# Patient Record
Sex: Female | Born: 1990 | Hispanic: No | Marital: Single | Smoking: Never smoker
Health system: Southern US, Community
[De-identification: ages and names within clinical notes are randomized; demographics above are authoritative.]

## PROBLEM LIST (undated history)

## (undated) DIAGNOSIS — J45909 Unspecified asthma, uncomplicated: Secondary | ICD-10-CM

## (undated) DIAGNOSIS — F32A Depression, unspecified: Secondary | ICD-10-CM

## (undated) DIAGNOSIS — F419 Anxiety disorder, unspecified: Secondary | ICD-10-CM

## (undated) HISTORY — PX: BARIATRIC SURGERY: SHX1103

---

## 2014-07-29 ENCOUNTER — Emergency Department (HOSPITAL_COMMUNITY)
Admission: EM | Admit: 2014-07-29 | Discharge: 2014-07-30 | Disposition: A | Discharge status: Home / Self Care | Payer: Self-pay | Attending: Emergency Medicine | Admitting: Emergency Medicine

## 2014-07-29 DIAGNOSIS — R05 Cough: Secondary | ICD-10-CM

## 2014-07-29 DIAGNOSIS — J45909 Unspecified asthma, uncomplicated: Secondary | ICD-10-CM | POA: Insufficient documentation

## 2014-07-29 DIAGNOSIS — R059 Cough, unspecified: Secondary | ICD-10-CM

## 2014-07-29 DIAGNOSIS — H9209 Otalgia, unspecified ear: Secondary | ICD-10-CM | POA: Insufficient documentation

## 2014-07-29 DIAGNOSIS — R1110 Vomiting, unspecified: Secondary | ICD-10-CM | POA: Insufficient documentation

## 2014-07-29 DIAGNOSIS — R000 Tachycardia, unspecified: Secondary | ICD-10-CM | POA: Insufficient documentation

## 2014-07-29 DIAGNOSIS — R0789 Other chest pain: Secondary | ICD-10-CM | POA: Insufficient documentation

## 2014-07-29 DIAGNOSIS — J159 Unspecified bacterial pneumonia: Secondary | ICD-10-CM | POA: Insufficient documentation

## 2014-07-29 DIAGNOSIS — Z79899 Other long term (current) drug therapy: Secondary | ICD-10-CM | POA: Insufficient documentation

## 2014-07-29 DIAGNOSIS — J189 Pneumonia, unspecified organism: Secondary | ICD-10-CM

## 2014-07-29 HISTORY — DX: Unspecified asthma, uncomplicated: J45.909

## 2014-07-29 NOTE — ED Notes (Signed)
Patient reports that she has had cold-like symptoms x 1 week that have progressively gotten worse.  Reports productive cough and bilateral ear pain.

## 2014-07-30 ENCOUNTER — Emergency Department (HOSPITAL_COMMUNITY): Payer: Self-pay

## 2014-07-30 ENCOUNTER — Encounter (HOSPITAL_COMMUNITY): Payer: Self-pay

## 2014-07-30 MED ORDER — LEVOFLOXACIN 750 MG PO TABS
750.0000 mg | ORAL_TABLET | Freq: Once | ORAL | Status: AC
  Administered 2014-07-30: 750 mg via ORAL
  Filled 2014-07-30: qty 1

## 2014-07-30 MED ORDER — LEVOFLOXACIN 250 MG PO TABS: 750.0000 mg | ORAL_TABLET | Freq: Every day | ORAL | Status: DC

## 2014-07-30 MED ORDER — BENZONATATE 100 MG PO CAPS: 100.0000 mg | ORAL_CAPSULE | Freq: Three times a day (TID) | ORAL | Status: DC

## 2014-07-30 MED ORDER — ALBUTEROL SULFATE HFA 108 (90 BASE) MCG/ACT IN AERS
2.0000 | INHALATION_SPRAY | Freq: Once | RESPIRATORY_TRACT | Status: AC
  Administered 2014-07-30: 2 via RESPIRATORY_TRACT
  Filled 2014-07-30: qty 6.7

## 2014-07-30 NOTE — ED Notes (Signed)
Patient transported to X-ray 

## 2014-07-30 NOTE — ED Provider Notes (Signed)
CSN: [id number]     Arrival date & time 07/29/14  2352 History   First MD Initiated Contact with Patient 07/30/14 0102     Chief Complaint  Patient presents with  . Cough    (Consider location/radiation/quality/duration/timing/severity/associated sxs/prior Treatment) HPI Comments: Patient is a 24 year old female with a history of asthma who presents to the emergency department for further evaluation of cough. Patient states that she has been experiencing a cough 4-5 days. She states the cough is deep and productive of green phlegm. She feels an associated generalized chest pressure which is worse when coughing. She also endorses associated posttussive emesis, chills, and fever of 101F 2 days ago. Patient has been taking NyQuil, sinus pills, Motrin, and tests and DM for symptoms without relief. She states her son was sick with similar symptoms initially, but his illness improved and hers feels as though it is worsening. No associated rhinorrhea, drooling, difficulty swallowing, hematemesis, or syncope.  Patient is a 24 y.o. female presenting with cough. The history is provided by the patient. No language interpreter was used.  Cough Associated symptoms: chills, ear pain and fever   Associated symptoms: no rhinorrhea     Past Medical History  Diagnosis Date  . Asthma    History reviewed. No pertinent past surgical history. History reviewed. No pertinent family history. History  Substance Use Topics  . Smoking status: Never Smoker   . Smokeless tobacco: Not on file  . Alcohol Use: No   OB History    No data available      Review of Systems  Constitutional: Positive for fever and chills.  HENT: Positive for congestion and ear pain. Negative for drooling, rhinorrhea and trouble swallowing.   Respiratory: Positive for cough and chest tightness.   Gastrointestinal: Positive for vomiting. Negative for nausea and abdominal pain.  Neurological: Negative for syncope.  All other systems  reviewed and are negative.   Allergies  Prednisone  Home Medications   Prior to Admission medications   Medication Sig Start Date End Date Taking? Authorizing Provider  benzonatate (TESSALON) 100 MG capsule Take 1 capsule (100 mg total) by mouth every 8 (eight) hours. 07/30/14   Antony Madura, PA-C  levofloxacin (LEVAQUIN) 250 MG tablet Take 3 tablets (750 mg total) by mouth daily. Begin your second dose on 07/31/14; your first dose was given in the ED on 07/30/14 07/30/14   Antony Madura, PA-C  venlafaxine XR (EFFEXOR-XR) 75 MG 24 hr capsule Take 75 mg by mouth daily with breakfast.   Yes Historical Provider, MD   BP 136/95 mmHg  Pulse 114  Temp(Src) 99 F (37.2 C) (Oral)  Resp 18  Ht  (1.626 m)  Wt 207 lb (93.895 kg)  BMI 35.51 kg/m2  SpO2 99%  LMP 07/23/2014   Physical Exam  Constitutional: She is oriented to person, place, and time. She appears well-developed and well-nourished. No distress.  Nontoxic/nonseptic appearing  HENT:  Head: Normocephalic and atraumatic.  Mouth/Throat: Oropharynx is clear and moist. No oropharyngeal exudate.  Audible nasal congestion. No rhinorrhea. Uvula midline and oropharynx clear. Patient tolerating secretions without difficulty. No maxillary or frontal sinus tenderness. No bulging, retraction, or perforation of bilateral TMs. No evidence of otitis media or mastoiditis.  Eyes: Conjunctivae and EOM are normal. No scleral icterus.  Neck: Normal range of motion.  No nuchal rigidity or meningismus  Cardiovascular: Regular rhythm and intact distal pulses.   Mild tachycardia  Pulmonary/Chest: Effort normal and breath sounds normal. No respiratory distress. She  has no wheezes. She has no rales.  Respirations even and unlabored. No wheezing or rales appreciated. No tachypnea or dyspnea.  Musculoskeletal: Normal range of motion.  Neurological: She is alert and oriented to person, place, and time. She exhibits normal muscle tone. Coordination normal.   GCS 15. Speech is goal oriented. Patient moves extremities without ataxia.  Skin: Skin is warm and dry. No rash noted. She is not diaphoretic. No erythema. No pallor.  Psychiatric: She has a normal mood and affect. Her behavior is normal.  Nursing note and vitals reviewed.   ED Course  Procedures (including critical care time) Labs Review Labs Reviewed - No data to display  Imaging Review Dg Chest 2 View  07/30/2014   CLINICAL DATA:  Cough for 1 week, increasing in severity. Upper chest pain.  EXAM: CHEST  2 VIEW  COMPARISON:  None.  FINDINGS: Minimal patchy opacity in the right midlung zone. Left lung is clear. Cardiomediastinal contours are normal. No pleural effusion or pneumothorax. No osseous abnormality is seen.  IMPRESSION: Minimal patchy opacity right mid lung zone consistent with pneumonia.   Electronically Signed   By: Rubye OaksMelanie  Ehinger M.D.   On: 07/30/2014 02:18     EKG Interpretation None      MDM   Final diagnoses:  Cough  CAP (community acquired pneumonia)    Patient has been diagnosed with CAP via chest xray. Symptoms include 4-5 days of chills, fever, cough productive of green sputum, posttussive emesis, and chest pressure which was worse with coughing. Positive hx of sick contacts. Pt is not ill appearing, immunocompromised, and does not have multiple comorbidities. She ambulates in the ED without hypoxia. No tachypnea or dyspnea at rest. Afebrile over ED course. Believe patient is appropriate for outpatient management of her pneumonia at this time. Levaquin initiated in ED. Will d/c with 4 day course as well as Rx for Tessalon. Albuterol inhaler provided PTD. Return precautions discussed and provided. Patient agreeable to plan with no unaddressed concerns. Patient discharged in good condition    Antony MaduraKelly Lenville Hibberd, [place] JerseyPA-C 07/30/14 0315  Elwin MochaBlair Walden, MD 07/30/14 [phone]

## 2014-07-30 NOTE — ED Notes (Signed)
Pt ambulated with out assistance no complaints of shortness of breath, SPO2=100 HR= 134. RN notifited

## 2014-07-30 NOTE — Discharge Instructions (Signed)
Take Levaquin as prescribed. Use an albuterol inhaler, 2 puffs every 4 hours, as needed for cough and shortness of breath. Take Tessalon for cough as needed. Return if symptoms worsen despite recommended treatment.  Pneumonia Pneumonia is an infection of the lungs.  CAUSES Pneumonia may be caused by bacteria or a virus. Usually, these infections are caused by breathing infectious particles into the lungs (respiratory tract). SIGNS AND SYMPTOMS   Cough.  Fever.  Chest pain.  Increased rate of breathing.  Wheezing.  Mucus production. DIAGNOSIS  If you have the common symptoms of pneumonia, your health care provider will typically confirm the diagnosis with a chest X-ray. The X-ray will show an abnormality in the lung (pulmonary infiltrate) if you have pneumonia. Other tests of your blood, urine, or sputum may be done to find the specific cause of your pneumonia. Your health care provider may also do tests (blood gases or pulse oximetry) to see how well your lungs are working. TREATMENT  Some forms of pneumonia may be spread to other people when you cough or sneeze. You may be asked to wear a mask before and during your exam. Pneumonia that is caused by bacteria is treated with antibiotic medicine. Pneumonia that is caused by the influenza virus may be treated with an antiviral medicine. Most other viral infections must run their course. These infections will not respond to antibiotics.  HOME CARE INSTRUCTIONS   Cough suppressants may be used if you are losing too much rest. However, coughing protects you by clearing your lungs. You should avoid using cough suppressants if you can.  Your health care provider may have prescribed medicine if he or she thinks your pneumonia is caused by bacteria or influenza. Finish your medicine even if you start to feel better.  Your health care provider may also prescribe an expectorant. This loosens the mucus to be coughed up.  Take medicines only as  directed by your health care provider.  Do not smoke. Smoking is a common cause of bronchitis and can contribute to pneumonia. If you are a smoker and continue to smoke, your cough may last several weeks after your pneumonia has cleared.  A cold steam vaporizer or humidifier in your room or home may help loosen mucus.  Coughing is often worse at night. Sleeping in a semi-upright position in a recliner or using a couple pillows under your head will help with this.  Get rest as you feel it is needed. Your body will usually let you know when you need to rest. PREVENTION A pneumococcal shot (vaccine) is available to prevent a common bacterial cause of pneumonia. This is usually suggested for:  People over 23 years old.  Patients on chemotherapy.  People with chronic lung problems, such as bronchitis or emphysema.  People with immune system problems. If you are over 65 or have a high risk condition, you may receive the pneumococcal vaccine if you have not received it before. In some countries, a routine influenza vaccine is also recommended. This vaccine can help prevent some cases of pneumonia.You may be offered the influenza vaccine as part of your care. If you smoke, it is time to quit. You may receive instructions on how to stop smoking. Your health care provider can provide medicines and counseling to help you quit. SEEK MEDICAL CARE IF: You have a fever. SEEK IMMEDIATE MEDICAL CARE IF:   Your illness becomes worse. This is especially true if you are elderly or weakened from any other disease.  You cannot control your cough with suppressants and are losing sleep.  You begin coughing up blood.  You develop pain which is getting worse or is uncontrolled with medicines.  Any of the symptoms which initially brought you in for treatment are getting worse rather than better.  You develop shortness of breath or chest pain. MAKE SURE YOU:   Understand these instructions.  Will watch  your condition.  Will get help right away if you are not doing well or get worse. Document Released: 07/05/2005 Document Revised: 11/19/2013 Document Reviewed: 09/24/2010 [facility] Patient Information 2015 [place], [place]. This information is not intended to replace advice given to you by your health care provider. Make sure you discuss any questions you have with your health care provider.

## 2014-08-12 ENCOUNTER — Emergency Department (HOSPITAL_COMMUNITY): Payer: Self-pay

## 2014-08-12 ENCOUNTER — Emergency Department (HOSPITAL_COMMUNITY)
Admission: EM | Admit: 2014-08-12 | Discharge: 2014-08-13 | Disposition: A | Discharge status: Home / Self Care | Payer: Self-pay | Attending: Emergency Medicine | Admitting: Emergency Medicine

## 2014-08-12 ENCOUNTER — Encounter (HOSPITAL_COMMUNITY): Payer: Self-pay | Admitting: Emergency Medicine

## 2014-08-12 DIAGNOSIS — Z3202 Encounter for pregnancy test, result negative: Secondary | ICD-10-CM | POA: Insufficient documentation

## 2014-08-12 DIAGNOSIS — R1110 Vomiting, unspecified: Secondary | ICD-10-CM | POA: Insufficient documentation

## 2014-08-12 DIAGNOSIS — Z79899 Other long term (current) drug therapy: Secondary | ICD-10-CM | POA: Insufficient documentation

## 2014-08-12 DIAGNOSIS — J45901 Unspecified asthma with (acute) exacerbation: Secondary | ICD-10-CM | POA: Insufficient documentation

## 2014-08-12 DIAGNOSIS — Z8701 Personal history of pneumonia (recurrent): Secondary | ICD-10-CM | POA: Insufficient documentation

## 2014-08-12 DIAGNOSIS — Z792 Long term (current) use of antibiotics: Secondary | ICD-10-CM | POA: Insufficient documentation

## 2014-08-12 LAB — URINE MICROSCOPIC-ADD ON

## 2014-08-12 LAB — URINALYSIS, ROUTINE W REFLEX MICROSCOPIC
Bilirubin Urine: NEGATIVE
Glucose, UA: NEGATIVE mg/dL
Hgb urine dipstick: NEGATIVE
Ketones, ur: NEGATIVE mg/dL
Nitrite: NEGATIVE
Protein, ur: NEGATIVE mg/dL
Specific Gravity, Urine: 1.018 (ref 1.005–1.030)
Urobilinogen, UA: 0.2 mg/dL (ref 0.0–1.0)
pH: 7 (ref 5.0–8.0)

## 2014-08-12 LAB — PREGNANCY, URINE: Preg Test, Ur: NEGATIVE

## 2014-08-12 MED ORDER — ALBUTEROL SULFATE (2.5 MG/3ML) 0.083% IN NEBU
5.0000 mg | INHALATION_SOLUTION | Freq: Once | RESPIRATORY_TRACT | Status: AC
  Administered 2014-08-12: 5 mg via RESPIRATORY_TRACT
  Filled 2014-08-12: qty 6

## 2014-08-12 MED ORDER — METHYLPREDNISOLONE SODIUM SUCC 125 MG IJ SOLR
125.0000 mg | Freq: Once | INTRAMUSCULAR | Status: AC
  Administered 2014-08-12: 125 mg via INTRAVENOUS
  Filled 2014-08-12: qty 2

## 2014-08-12 MED ORDER — IPRATROPIUM BROMIDE 0.02 % IN SOLN
0.5000 mg | Freq: Once | RESPIRATORY_TRACT | Status: AC
  Administered 2014-08-12: 0.5 mg via RESPIRATORY_TRACT
  Filled 2014-08-12: qty 2.5

## 2014-08-12 MED ORDER — HYDROCODONE-HOMATROPINE 5-1.5 MG/5ML PO SYRP
5.0000 mL | ORAL_SOLUTION | Freq: Once | ORAL | Status: AC
  Administered 2014-08-12: 5 mL via ORAL
  Filled 2014-08-12: qty 5

## 2014-08-12 MED ORDER — SODIUM CHLORIDE 0.9 % IV BOLUS (SEPSIS)
500.0000 mL | Freq: Once | INTRAVENOUS | Status: AC
  Administered 2014-08-13: 500 mL via INTRAVENOUS

## 2014-08-12 NOTE — ED Notes (Signed)
Pt c/o SOB , states she has had pneumonia about 1.5 weeks ago, pt states she still has a horrible cough to the point of vomiting and today she became SOB w/o relief from inhaler or neb tx.

## 2014-08-13 MED ORDER — PROMETHAZINE HCL 25 MG PO TABS: 25.0000 mg | ORAL_TABLET | Freq: Four times a day (QID) | ORAL | Status: DC | PRN

## 2014-08-13 MED ORDER — IPRATROPIUM BROMIDE 0.02 % IN SOLN: 0.5000 mg | Freq: Four times a day (QID) | RESPIRATORY_TRACT | Status: DC

## 2014-08-13 MED ORDER — ALBUTEROL SULFATE (2.5 MG/3ML) 0.083% IN NEBU: 5.0000 mg | INHALATION_SOLUTION | Freq: Four times a day (QID) | RESPIRATORY_TRACT | Status: AC | PRN

## 2014-08-13 MED ORDER — BENZONATATE 100 MG PO CAPS: 100.0000 mg | ORAL_CAPSULE | Freq: Three times a day (TID) | ORAL | Status: DC

## 2014-08-13 NOTE — Discharge Instructions (Signed)
1. Medications: Albuterol and Atrovent nebulizer solution, Tessalon, Phenergan for vomiting usual home medications 2. Treatment: rest, drink plenty of fluids, Zyrtec every day 3. Follow Up: Please followup with your primary doctor in 3 days for discussion of your diagnoses and further evaluation after today's visit; if you do not have a primary care doctor use the resource guide provided to find one; Please return to the ER for increasing shortness of breath, persistent wheezing or other concerning symptoms    Asthma Attack Prevention Although there is no way to prevent asthma from starting, you can take steps to control the disease and reduce its symptoms. Learn about your asthma and how to control it. Take an active role to control your asthma by working with your health care provider to create and follow an asthma action plan. An asthma action plan guides you in:  Taking your medicines properly.  Avoiding things that set off your asthma or make your asthma worse (asthma triggers).  Tracking your level of asthma control.  Responding to worsening asthma.  Seeking emergency care when needed. To track your asthma, keep records of your symptoms, check your peak flow number using a handheld device that shows how well air moves out of your lungs (peak flow meter), and get regular asthma checkups.  WHAT ARE SOME WAYS TO PREVENT AN ASTHMA ATTACK?  Take medicines as directed by your health care provider.  Keep track of your asthma symptoms and level of control.  With your health care provider, write a detailed plan for taking medicines and managing an asthma attack. Then be sure to follow your action plan. Asthma is an ongoing condition that needs regular monitoring and treatment.  Identify and avoid asthma triggers. Many outdoor allergens and irritants (such as pollen, mold, cold air, and air pollution) can trigger asthma attacks. Find out what your asthma triggers are and take steps to avoid  them.  Monitor your breathing. Learn to recognize warning signs of an attack, such as coughing, wheezing, or shortness of breath. Your lung function may decrease before you notice any signs or symptoms, so regularly measure and record your peak airflow with a home peak flow meter.  Identify and treat attacks early. If you act quickly, you are less likely to have a severe attack. You will also need less medicine to control your symptoms. When your peak flow measurements decrease and alert you to an upcoming attack, take your medicine as instructed and immediately stop any activity that may have triggered the attack. If your symptoms do not improve, get medical help.  Pay attention to increasing quick-relief inhaler use. If you find yourself relying on your quick-relief inhaler, your asthma is not under control. See your health care provider about adjusting your treatment. WHAT CAN MAKE MY SYMPTOMS WORSE? A number of common things can set off or make your asthma symptoms worse and cause temporary increased inflammation of your airways. Keep track of your asthma symptoms for several weeks, detailing all the environmental and emotional factors that are linked with your asthma. When you have an asthma attack, go back to your asthma diary to see which factor, or combination of factors, might have contributed to it. Once you know what these factors are, you can take steps to control many of them. If you have allergies and asthma, it is important to take asthma prevention steps at home. Minimizing contact with the substance to which you are allergic will help prevent an asthma attack. Some triggers and ways to  avoid these triggers are: Animal Dander:  Some people are allergic to the flakes of skin or dried saliva from animals with fur or feathers.   There is no such thing as a hypoallergenic dog or cat breed. All dogs or cats can cause allergies, even if they don't shed.  Keep these pets out of your  home.  If you are not able to keep a pet outdoors, keep the pet out of your bedroom and other sleeping areas at all times, and keep the door closed.  Remove carpets and furniture covered with cloth from your home. If that is not possible, keep the pet away from fabric-covered furniture and carpets. Dust Mites: Many people with asthma are allergic to dust mites. Dust mites are tiny bugs that are found in every home in mattresses, pillows, carpets, fabric-covered furniture, bedcovers, clothes, stuffed toys, and other fabric-covered items.   Cover your mattress in a special dust-proof cover.  Cover your pillow in a special dust-proof cover, or wash the pillow each week in hot water. Water must be hotter than 130 F (54.4 C) to kill dust mites. Cold or warm water used with detergent and bleach can also be effective.  Wash the sheets and blankets on your bed each week in hot water.  Try not to sleep or lie on cloth-covered cushions.  Call ahead when traveling and ask for a smoke-free hotel room. Bring your own bedding and pillows in case the hotel only supplies feather pillows and down comforters, which may contain dust mites and cause asthma symptoms.  Remove carpets from your bedroom and those laid on concrete, if you can.  Keep stuffed toys out of the bed, or wash the toys weekly in hot water or cooler water with detergent and bleach. Cockroaches: Many people with asthma are allergic to the droppings and remains of cockroaches.   Keep food and garbage in closed containers. Never leave food out.  Use poison baits, traps, powders, gels, or paste (for example, boric acid).  If a spray is used to kill cockroaches, stay out of the room until the odor goes away. Indoor Mold:  Fix leaky faucets, pipes, or other sources of water that have mold around them.  Clean floors and moldy surfaces with a fungicide or diluted bleach.  Avoid using humidifiers, vaporizers, or swamp coolers. These can  spread molds through the air. Pollen and Outdoor Mold:  When pollen or mold spore counts are high, try to keep your windows closed.  Stay indoors with windows closed from late morning to afternoon. Pollen and some mold spore counts are highest at that time.  Ask your health care provider whether you need to take anti-inflammatory medicine or increase your dose of the medicine before your allergy season starts. Other Irritants to Avoid:  Tobacco smoke is an irritant. If you smoke, ask your health care provider how you can quit. Ask family members to quit smoking, too. Do not allow smoking in your home or car.  If possible, do not use a wood-burning stove, kerosene heater, or fireplace. Minimize exposure to all sources of smoke, including incense, candles, fires, and fireworks.  Try to stay away from strong odors and sprays, such as perfume, talcum powder, hair spray, and paints.  Decrease humidity in your home and use an indoor air cleaning device. Reduce indoor humidity to below 60%. Dehumidifiers or central air conditioners can do this.  Decrease house dust exposure by changing furnace and air cooler filters frequently.  Try to  have someone else vacuum for you once or twice a week. Stay out of rooms while they are being vacuumed and for a short while afterward.  If you vacuum, use a dust mask from a hardware store, a double-layered or microfilter vacuum cleaner bag, or a vacuum cleaner with a HEPA filter.  Sulfites in foods and beverages can be irritants. Do not drink beer or wine or eat dried fruit, processed potatoes, or shrimp if they cause asthma symptoms.  Cold air can trigger an asthma attack. Cover your nose and mouth with a scarf on cold or windy days.  Several health conditions can make asthma more difficult to manage, including a runny nose, sinus infections, reflux disease, psychological stress, and sleep apnea. Work with your health care provider to manage these  conditions.  Avoid close contact with people who have a respiratory infection such as a cold or the flu, since your asthma symptoms may get worse if you catch the infection. Wash your hands thoroughly after touching items that may have been handled by people with a respiratory infection.  Get a flu shot every year to protect against the flu virus, which often makes asthma worse for days or weeks. Also get a pneumonia shot if you have not previously had one. Unlike the flu shot, the pneumonia shot does not need to be given yearly. Medicines:  Talk to your health care provider about whether it is safe for you to take aspirin or non-steroidal anti-inflammatory medicines (NSAIDs). In a small number of people with asthma, aspirin and NSAIDs can cause asthma attacks. These medicines must be avoided by people who have known aspirin-sensitive asthma. It is important that people with aspirin-sensitive asthma read labels of all over-the-counter medicines used to treat pain, colds, coughs, and fever.  Beta-blockers and ACE inhibitors are other medicines you should discuss with your health care provider. HOW CAN I FIND OUT WHAT I AM ALLERGIC TO? Ask your asthma health care provider about allergy skin testing or blood testing (the RAST test) to identify the allergens to which you are sensitive. If you are found to have allergies, the most important thing to do is to try to avoid exposure to any allergens that you are sensitive to as much as possible. Other treatments for allergies, such as medicines and allergy shots (immunotherapy) are available.  CAN I EXERCISE? Follow your health care provider's advice regarding asthma treatment before exercising. It is important to maintain a regular exercise program, but vigorous exercise or exercise in cold, humid, or dry environments can cause asthma attacks, especially for those people who have exercise-induced asthma. Document Released: 06/23/2009 Document Revised:  07/10/2013 Document Reviewed: 01/10/2013 [facility] Patient Information 2015 [place], [place]. This information is not intended to replace advice given to you by your health care provider. Make sure you discuss any questions you have with your health care provider.

## 2014-08-13 NOTE — ED Provider Notes (Signed)
CSN: [id number]     Arrival date & time 08/12/14  1749 History   First MD Initiated Contact with Patient 08/12/14 2244     Chief Complaint  Patient presents with  . Shortness of Breath     (Consider location/radiation/quality/duration/timing/severity/associated sxs/prior Treatment) Patient is a 24 y.o. female presenting with shortness of breath. The history is provided by medical records. No language interpreter was used.  Shortness of Breath Associated symptoms: vomiting ( post tussive) and wheezing   Associated symptoms: no abdominal pain, no chest pain, no cough, no diaphoresis, no fever, no headaches and no rash      Gina Trujillo is a 24 y.o. female  with a hx of astham presents to the Emergency Department complaining of gradual, persistent, progressively worsening cough, SOB and intermittent post tussive emesis onset 2 weeks ago.  Pt reports on 07/30/14 she was diagnosed with community-acquired pneumonia. Patient reports she was treated with an antibiotic and felt better for a few days but has continued to have cough and wheezing and shortness of breath. Patient reports that she has been using her brother's albuterol nebulizer which gives her significant relief for several hours but it sometimes makes her feel lightheaded. She reports she has been using her albuterol MDI without relief.  She reports no fevers or chills, however she has had multiple episodes of posttussive emesis.  Patient denies headache, neck pain, chest pain, abdominal pain, nausea, diarrhea, weakness, dizziness, syncope, dysuria.  Patient reports she is unable to present because it causes severe abdominal pain.  Past Medical History  Diagnosis Date  . Asthma    History reviewed. No pertinent past surgical history. No family history on file. History  Substance Use Topics  . Smoking status: Never Smoker   . Smokeless tobacco: Not on file  . Alcohol Use: No   OB History    No data available     Review of  Systems  Constitutional: Negative for fever, diaphoresis, appetite change, fatigue and unexpected weight change.  HENT: Negative for mouth sores.   Eyes: Negative for visual disturbance.  Respiratory: Positive for choking, chest tightness, shortness of breath and wheezing. Negative for cough.   Cardiovascular: Negative for chest pain.  Gastrointestinal: Positive for vomiting ( post tussive). Negative for nausea, abdominal pain, diarrhea and constipation.  Endocrine: Negative for polydipsia, polyphagia and polyuria.  Genitourinary: Negative for dysuria, urgency, frequency and hematuria.  Musculoskeletal: Negative for back pain and neck stiffness.  Skin: Negative for rash.  Allergic/Immunologic: Negative for immunocompromised state.  Neurological: Negative for syncope, light-headedness and headaches.  Hematological: Does not bruise/bleed easily.  Psychiatric/Behavioral: Negative for sleep disturbance. The patient is not nervous/anxious.       Allergies  Prednisone  Home Medications   Prior to Admission medications   Medication Sig Start Date End Date Taking? Authorizing Provider  MELATONIN PO Take 2 mg by mouth at bedtime as needed (sleep).   Yes Historical Provider, MD  venlafaxine XR (EFFEXOR-XR) 75 MG 24 hr capsule Take 75 mg by mouth daily with breakfast.   Yes Historical Provider, MD  albuterol (PROVENTIL) (2.5 MG/3ML) 0.083% nebulizer solution Take 6 mLs (5 mg total) by nebulization every 6 (six) hours as needed for wheezing or shortness of breath. 08/13/14   Dahlia Client King Pinzon, PA-C  benzonatate (TESSALON) 100 MG capsule Take 1 capsule (100 mg total) by mouth every 8 (eight) hours. 08/13/14   Dorreen Valiente, PA-C  ipratropium (ATROVENT) 0.02 % nebulizer solution Take 2.5 mLs (0.5 mg total) by  nebulization 4 (four) times daily. With your albuterol as needed for wheezing 08/13/14   [name], PA-C  levofloxacin (LEVAQUIN) 250 MG tablet Take 3 tablets (750 mg total) by  mouth daily. Begin your second dose on 07/31/14; your first dose was given in the ED on 07/30/14 Patient not taking: Reported on 08/12/2014 07/30/14   Antony MaduraKelly Humes, PA-C  promethazine (PHENERGAN) 25 MG tablet Take 1 tablet (25 mg total) by mouth every 6 (six) hours as needed for nausea or vomiting. 08/13/14   Dahlia ClientHannah Branson Kranz, PA-C   BP 140/90 mmHg  Pulse 108  Temp(Src) 98.3 F (36.8 C) (Oral)  Resp 22  SpO2 100%  LMP 07/23/2014 Physical Exam  Constitutional: She is oriented to person, place, and time. She appears well-developed and well-nourished. No distress.  HENT:  Head: Normocephalic and atraumatic.  Right Ear: Tympanic membrane, external ear and ear canal normal.  Left Ear: Tympanic membrane, external ear and ear canal normal.  Nose: Mucosal edema and rhinorrhea present. No epistaxis. Right sinus exhibits no maxillary sinus tenderness and no frontal sinus tenderness. Left sinus exhibits no maxillary sinus tenderness and no frontal sinus tenderness.  Mouth/Throat: Uvula is midline, oropharynx is clear and moist and mucous membranes are normal. Mucous membranes are not pale and not cyanotic. No oropharyngeal exudate, posterior oropharyngeal edema, posterior oropharyngeal erythema or tonsillar abscesses.  Eyes: Conjunctivae are normal. Pupils are equal, round, and reactive to light.  Neck: Normal range of motion and full passive range of motion without pain.  Cardiovascular: Normal rate, normal heart sounds and intact distal pulses.   Pulmonary/Chest: Effort normal. No accessory muscle usage or stridor. No tachypnea. No respiratory distress. She has decreased breath sounds. She has wheezes.  Patient consistently coughing every time she attempts to be Decreased breath sounds throughout with expiratory and expiratory wheezes No focal rhonchi or rales  Abdominal: Soft. Bowel sounds are normal. She exhibits no distension. There is no tenderness.  Musculoskeletal: Normal range of motion.   Lymphadenopathy:    She has no cervical adenopathy.  Neurological: She is alert and oriented to person, place, and time.  Skin: Skin is warm and dry. No rash noted. She is not diaphoretic. No erythema.  Psychiatric: She has a normal mood and affect.  Nursing note and vitals reviewed.   ED Course  Procedures (including critical care time) Labs Review Labs Reviewed  URINALYSIS, ROUTINE W REFLEX MICROSCOPIC - Abnormal; Notable for the following:    APPearance TURBID (*)    Leukocytes, UA SMALL (*)    All other components within normal limits  URINE MICROSCOPIC-ADD ON - Abnormal; Notable for the following:    Squamous Epithelial / LPF FEW (*)    Bacteria, UA MANY (*)    All other components within normal limits  PREGNANCY, URINE    Imaging Review Dg Chest 2 View  08/12/2014   CLINICAL DATA:  Short of breath.  Pneumonia.  Tussive emesis.  EXAM: CHEST  2 VIEW  COMPARISON:  07/30/2014.  Subsequent encounter.  FINDINGS: Cardiopericardial silhouette within normal limits. Mediastinal contours normal. Trachea midline. No airspace disease or effusion. Previously seen patchy airspace disease in the RIGHT mid lung has resolved completely.  IMPRESSION: No active cardiopulmonary disease.   Electronically Signed   By: Andreas NewportGeoffrey  Lamke M.D.   On: 08/12/2014 19:13     EKG Interpretation None      MDM   Final diagnoses:  Asthma exacerbation   Gina BambergBreona Trujillo presents with posttussive emesis and asthma exacerbation. I  believe the patient's cough is likely persistent from her pneumonia in addition to bronchospasm from an asthma exacerbation. Patient  with chest x-ray does not show persistent or enlarging pneumonia in fact previously patchy airspace disease in the right mid lung base has resolved completely. Will treat like asthma exacerbation and reassess.  11:56PM Patient ambulated in ED with O2 saturations maintained >90, no current signs of respiratory distress. Lung exam improved after  nebulizer treatment patient is breathing at baseline. Significant improvement in her cough. No posttussive emesis here in the emergency department and she is tolerating by mouth without difficulty.  Solu-Medrol given in the ED without abdominal pain.  Discussed with patient my concern about the seen home without steroids however she wishes to attempt.. Pt states they are breathing at baseline. She has been given a prescription for Tessalon pearls and albuterol and Atrovent nebulized solution. Pt has been instructed to continue using prescribed medications and to speak with PCP about today's exacerbation.   I have personally reviewed patient's vitals, nursing note and any pertinent labs or imaging.  I performed an undressed physical exam.    It has been determined that no acute conditions requiring further emergency intervention are present at this time. The patient/guardian have been advised of the diagnosis and plan. I reviewed all labs and imaging including any potential incidental findings. We have discussed signs and symptoms that warrant return to the ED and they are listed in the discharge instructions.    Vital signs are stable at discharge. Mild tachycardia on arrival however likely secondary to shortness of breath. Tachycardia discharge due to albuterol.  BP 140/90 mmHg  Pulse 108  Temp(Src) 98.3 F (36.8 C) (Oral)  Resp 22  SpO2 100%  LMP 07/23/2014         Dahlia Client Kobey Sides, PA-C 08/13/14 0134  Ethelda Chick, MD 08/13/14 1505

## 2015-06-20 ENCOUNTER — Ambulatory Visit (INDEPENDENT_AMBULATORY_CARE_PROVIDER_SITE_OTHER): Payer: Medicaid Other | Admitting: Certified Nurse Midwife

## 2015-06-20 ENCOUNTER — Encounter: Payer: Self-pay | Admitting: Certified Nurse Midwife

## 2015-06-20 VITALS — BP 134/92 | HR 87 | Temp 98.2°F | Ht 64.0 in | Wt 227.0 lb

## 2015-06-20 DIAGNOSIS — N760 Acute vaginitis: Secondary | ICD-10-CM | POA: Diagnosis not present

## 2015-06-20 DIAGNOSIS — Z30011 Encounter for initial prescription of contraceptive pills: Secondary | ICD-10-CM | POA: Diagnosis not present

## 2015-06-20 DIAGNOSIS — A499 Bacterial infection, unspecified: Secondary | ICD-10-CM | POA: Diagnosis not present

## 2015-06-20 DIAGNOSIS — B9689 Other specified bacterial agents as the cause of diseases classified elsewhere: Secondary | ICD-10-CM

## 2015-06-20 MED ORDER — LEVONORGEST-ETH ESTRAD 91-DAY 0.15-0.03 &0.01 MG PO TABS: 1.0000 | ORAL_TABLET | Freq: Every day | ORAL | Status: DC

## 2015-06-20 NOTE — Progress Notes (Signed)
Patient ID: Gina Trujillo, female   DOB: Apr 14, 1991, 24 y.o.   MRN: [id number]   Chief Complaint  Patient presents with  . Vaginitis    Recurrent Bacterial infections.   . Contraception    HPI Gina Trujillo is a 24 y.o. female.   Is having recurrent BV since she was 6116-24 years of age.  Is not currently on contraception.  Currently sexually active.  Never had an STI/STD.  Has tried Flagyl and clindamycin since she was 24 years of age.  Has tried tea tree baths, probiotics.  Is using dove soap on labia.  Does not take baths.  Periods are monthly lasting about 7 days, with menorrhagia, uses tampons.  Denies any cramping and clots.  Is able to take pills the same time every day.      HPI  Past Medical History  Diagnosis Date  . Asthma     History reviewed. No pertinent past surgical history.  Family History  Problem Relation Age of Onset  . Heart disease Father     Social History Social History  Substance Use Topics  . Smoking status: Never Smoker   . Smokeless tobacco: None  . Alcohol Use: 0.0 oz/week    0 Standard drinks or equivalent per week     Comment: Socially    Allergies  Allergen Reactions  . Prednisone     Sharp abdominal pain, diarrhea    Current Outpatient Prescriptions  Medication Sig Dispense Refill  . venlafaxine XR (EFFEXOR-XR) 75 MG 24 hr capsule Take 75 mg by mouth daily with breakfast.    . albuterol (PROVENTIL) (2.5 MG/3ML) 0.083% nebulizer solution Take 6 mLs (5 mg total) by nebulization every 6 (six) hours as needed for wheezing or shortness of breath. (Patient not taking: Reported on 06/20/2015) 75 mL 12  . Levonorgestrel-Ethinyl Estradiol (SEASONIQUE) 0.15-0.03 &0.01 MG tablet Take 1 tablet by mouth daily. 1 Package 4   No current facility-administered medications for this visit.    Review of Systems Review of Systems Constitutional: negative for fatigue and weight loss Respiratory: negative for cough and wheezing Cardiovascular:  negative for chest pain, fatigue and palpitations Gastrointestinal: negative for abdominal pain and change in bowel habits Genitourinary:negative Integument/breast: negative for nipple discharge Musculoskeletal:negative for myalgias Neurological: negative for gait problems and tremors Behavioral/Psych: negative for abusive relationship, depression Endocrine: negative for temperature intolerance     Blood pressure 134/92, pulse 87, temperature 98.2 F (36.8 C), height 5\' 4"  (1.626 m), weight 227 lb (102.967 kg), last menstrual period 05/28/2015.  Physical Exam Physical Exam General:   alert  Skin:   no rash or abnormalities  Lungs:   clear to auscultation bilaterally  Heart:   regular rate and rhythm, S1, S2 normal, no murmur, click, rub or gallop  Breasts:   normal without suspicious masses, skin or nipple changes or axillary nodes  Abdomen:  normal findings: no organomegaly, soft, non-tender and no hernia  Pelvis:  External genitalia: normal general appearance Urinary system: urethral meatus normal and bladder without fullness, nontender Vaginal: normal without tenderness, induration or masses Cervix: normal appearance Adnexa: normal bimanual exam Uterus: anteverted and non-tender, normal size    50% of 30 min visit spent on counseling and coordination of care.   Data Reviewed Medical hx, meds  Assessment     Chronic BV High risk sexual behavior     Plan   RX: boric acid vaginal suppositories   Orders Placed This Encounter  Procedures  . SureSwab, Vaginosis/Vaginitis Plus  Meds ordered this encounter  Medications  . Levonorgestrel-Ethinyl Estradiol (SEASONIQUE) 0.15-0.03 &0.01 MG tablet    Sig: Take 1 tablet by mouth daily.    Dispense:  1 Package    Refill:  4    Need to obtain previous records Possible management options include: Long term Metrogel, another OCP Follow up 6-8 weeks for annual exam.

## 2015-06-25 LAB — SURESWAB, VAGINOSIS/VAGINITIS PLUS
Atopobium vaginae: 7.2 Log (cells/mL)
C. albicans, DNA: NOT DETECTED
C. glabrata, DNA: NOT DETECTED
C. parapsilosis, DNA: NOT DETECTED
C. trachomatis RNA, TMA: NOT DETECTED
C. tropicalis, DNA: NOT DETECTED
Gardnerella vaginalis: >8 Log (cells/mL)
LACTOBACILLUS SPECIES: NOT DETECTED Log (cells/mL)
MEGASPHAERA SPECIES: 7.2 Log (cells/mL)
N. gonorrhoeae RNA, TMA: NOT DETECTED
T. vaginalis RNA, QL TMA: NOT DETECTED

## 2015-07-01 ENCOUNTER — Other Ambulatory Visit: Payer: Self-pay | Admitting: Certified Nurse Midwife

## 2015-07-01 DIAGNOSIS — N760 Acute vaginitis: Principal | ICD-10-CM

## 2015-07-01 DIAGNOSIS — B9689 Other specified bacterial agents as the cause of diseases classified elsewhere: Secondary | ICD-10-CM

## 2015-07-01 MED ORDER — METRONIDAZOLE 500 MG PO TABS
500.0000 mg | ORAL_TABLET | Freq: Two times a day (BID) | ORAL | Status: DC
Start: 2015-07-01 — End: 2015-08-06

## 2015-07-16 ENCOUNTER — Telehealth: Payer: Self-pay | Admitting: *Deleted

## 2015-07-16 MED ORDER — FLUCONAZOLE 150 MG PO TABS: 150.0000 mg | ORAL_TABLET | ORAL | Status: DC

## 2015-07-16 NOTE — Telephone Encounter (Signed)
Patient is requesting yeast medication.  3:20 Per lab note- Rx for diflucan sent to patient's pharmacy.

## 2015-08-06 ENCOUNTER — Encounter: Payer: Self-pay | Admitting: Certified Nurse Midwife

## 2015-08-06 ENCOUNTER — Ambulatory Visit (INDEPENDENT_AMBULATORY_CARE_PROVIDER_SITE_OTHER): Payer: Medicaid Other | Admitting: Certified Nurse Midwife

## 2015-08-06 VITALS — BP 129/86 | HR 98 | Temp 97.8°F | Wt 226.0 lb

## 2015-08-06 DIAGNOSIS — N760 Acute vaginitis: Secondary | ICD-10-CM

## 2015-08-06 MED ORDER — FLUCONAZOLE 100 MG PO TABS: 100.0000 mg | ORAL_TABLET | Freq: Once | ORAL | Status: DC

## 2015-08-06 NOTE — Progress Notes (Signed)
Patient ID: Gina Trujillo, female   DOB: Dec 16, 1990, 25 y.o.   MRN: [id number]  Chief Complaint  Patient presents with  . Vaginitis    possible yeast infection    HPI Gina Trujillo is a 25 y.o. female.  Has been having vaginal irritation with itching for about a week and a half.  Denies any odor, dyuria or pyuria.  Has not tried anything for it.  Desires natural planning methods.  Desires pregnancy after May, is getting married in May.  Had extreme nausea with OCPs.  Has tried nuva ring, Depo injections and Mirena in the past.  Declines OCPs today.  Discussed natural planning methods today.    HPI  Past Medical History  Diagnosis Date  . Asthma     No past surgical history on file.  Family History  Problem Relation Age of Onset  . Heart disease Father     Social History Social History  Substance Use Topics  . Smoking status: Never Smoker   . Smokeless tobacco: Not on file  . Alcohol Use: 0.0 oz/week    0 Standard drinks or equivalent per week     Comment: Socially    Allergies  Allergen Reactions  . Prednisone     Sharp abdominal pain, diarrhea    Current Outpatient Prescriptions  Medication Sig Dispense Refill  . albuterol (PROVENTIL) (2.5 MG/3ML) 0.083% nebulizer solution Take 6 mLs (5 mg total) by nebulization every 6 (six) hours as needed for wheezing or shortness of breath. 75 mL 12  . venlafaxine XR (EFFEXOR-XR) 75 MG 24 hr capsule Take 75 mg by mouth daily with breakfast.    . fluconazole (DIFLUCAN) 100 MG tablet Take 1 tablet (100 mg total) by mouth once. Repeat dose in 48-72 hour. 3 tablet 0  . Levonorgestrel-Ethinyl Estradiol (SEASONIQUE) 0.15-0.03 &0.01 MG tablet Take 1 tablet by mouth daily. (Patient not taking: Reported on 08/06/2015) 1 Package 4   No current facility-administered medications for this visit.    Review of Systems Review of Systems Constitutional: negative for fatigue and weight loss Respiratory: negative for cough and  wheezing Cardiovascular: negative for chest pain, fatigue and palpitations Gastrointestinal: negative for abdominal pain and change in bowel habits Genitourinary:+ change in vaginal discharge and itching Integument/breast: negative for nipple discharge Musculoskeletal:negative for myalgias Neurological: negative for gait problems and tremors Behavioral/Psych: negative for abusive relationship, depression Endocrine: negative for temperature intolerance     Blood pressure 129/86, pulse 98, temperature 97.8 F (36.6 C), weight 226 lb (102.513 kg), last menstrual period 07/10/2015.  Physical Exam Physical Exam General:   alert  Skin:   no rash or abnormalities  Lungs:   clear to auscultation bilaterally  Heart:   regular rate and rhythm, S1, S2 normal, no murmur, click, rub or gallop  Breasts:   normal without suspicious masses, skin or nipple changes or axillary nodes  Abdomen:  normal findings: no organomegaly, soft, non-tender and no hernia  Pelvis:  External genitalia: normal general appearance Urinary system: urethral meatus normal and bladder without fullness, nontender Vaginal: normal without tenderness, induration or masses. + erythema Cervix: no CMT Adnexa: normal bimanual exam Uterus: anteverted and non-tender, normal size    50% of 15 min visit spent on counseling and coordination of care.   Data Reviewed Previous medical hx, meds  Assessment     Vaginitis     Plan    Orders Placed This Encounter  Procedures  . SureSwab, Vaginosis/Vaginitis Plus   Meds ordered this encounter  Medications  . fluconazole (DIFLUCAN) 100 MG tablet    Sig: Take 1 tablet (100 mg total) by mouth once. Repeat dose in 48-72 hour.    Dispense:  3 tablet    Refill:  0     Follow up as needed.

## 2015-08-10 LAB — SURESWAB, VAGINOSIS/VAGINITIS PLUS
Atopobium vaginae: DETECTED Log (cells/mL)
C. albicans, DNA: NOT DETECTED
C. glabrata, DNA: NOT DETECTED
C. parapsilosis, DNA: NOT DETECTED
C. trachomatis RNA, TMA: NOT DETECTED
C. tropicalis, DNA: NOT DETECTED
Gardnerella vaginalis: 7 Log (cells/mL)
LACTOBACILLUS SPECIES: NOT DETECTED Log (cells/mL)
MEGASPHAERA SPECIES: NOT DETECTED Log (cells/mL)
N. gonorrhoeae RNA, TMA: NOT DETECTED
T. vaginalis RNA, QL TMA: NOT DETECTED

## 2015-08-12 ENCOUNTER — Other Ambulatory Visit: Payer: Self-pay | Admitting: Certified Nurse Midwife

## 2015-08-12 MED ORDER — METRONIDAZOLE 500 MG PO TABS: 500.0000 mg | ORAL_TABLET | Freq: Two times a day (BID) | ORAL | Status: DC

## 2015-08-22 ENCOUNTER — Ambulatory Visit: Payer: Medicaid Other | Admitting: Certified Nurse Midwife

## 2015-08-26 ENCOUNTER — Ambulatory Visit: Payer: Medicaid Other | Admitting: Certified Nurse Midwife

## 2016-04-14 ENCOUNTER — Telehealth: Payer: Self-pay | Admitting: *Deleted

## 2016-04-14 NOTE — Telephone Encounter (Signed)
Patient has another flare up of BV.Flagyl 500 mg po BID x 7 days #14 and Diflucan 150 mg po 1 tablet to repeat in 24-72 hours [facility] [address] [place] [phone].

## 2016-09-08 ENCOUNTER — Ambulatory Visit (INDEPENDENT_AMBULATORY_CARE_PROVIDER_SITE_OTHER): Payer: Medicaid Other | Admitting: Podiatry

## 2016-09-08 DIAGNOSIS — L600 Ingrowing nail: Secondary | ICD-10-CM

## 2016-09-08 DIAGNOSIS — L03039 Cellulitis of unspecified toe: Secondary | ICD-10-CM | POA: Diagnosis not present

## 2016-09-08 DIAGNOSIS — M79676 Pain in unspecified toe(s): Secondary | ICD-10-CM

## 2016-09-08 NOTE — Progress Notes (Signed)
   Subjective: Patient presents today as a new patient for evaluation of a possible ingrown toenail to the left great toe lateral border. [place] 2 [place] ago. She has had multiple ingrown procedures performed.  Objective:  General: Well developed, nourished, in no acute distress, alert and oriented x3   Dermatology: Skin is warm, dry and supple bilateral. Lateral aspect of the left great toe appears to be erythematous with evidence of an ingrowing nail. Purulent drainage noted with intruding nail into the respective nail fold. Pain on palpation noted to the border of the nail fold. The remaining nails appear unremarkable at this time. There are no open sores, lesions.  Vascular: Dorsalis Pedis artery and Posterior Tibial artery pedal pulses palpable. No lower extremity edema noted.   Neruologic: Grossly intact via light touch bilateral.  Musculoskeletal: Muscular strength within normal limits in all groups bilateral. Normal range of motion noted to all pedal and ankle joints.   Assesement: #1 Paronychia with ingrowing nail lateral aspect of the left great toe #2 Pain in toe #3 Incurvated nail  Plan of Care:  1. Patient evaluated.  2. Discussed treatment alternatives and plan of care. Explained nail avulsion procedure and post procedure course to patient. 3. Patient opted for permanent partial nail avulsion.  4. Prior to procedure, local anesthesia infiltration utilized using 3 ml of a 50:50 mixture of 2% plain lidocaine and 0.5% plain marcaine in a normal hallux block fashion and a betadine prep performed.  5. Partial permanent nail avulsion with chemical matrixectomy performed using 3x30sec applications of phenol followed by alcohol flush.  6. Light dressing applied. 7. Return to clinic in 2 weeks.   Felecia ShellingBrent M. Clayson Riling, DPM [facility]  Dr. Felecia ShellingBrent M. Tyana Butzer, DPM    [address]                                          [place], [place] [address]                Office [phone]  Fax [phone]

## 2016-09-22 ENCOUNTER — Ambulatory Visit: Payer: Medicaid Other | Admitting: Podiatry

## 2019-11-16 DIAGNOSIS — J301 Allergic rhinitis due to pollen: Secondary | ICD-10-CM | POA: Diagnosis not present

## 2019-11-16 DIAGNOSIS — J4540 Moderate persistent asthma, uncomplicated: Secondary | ICD-10-CM | POA: Diagnosis not present

## 2019-11-16 DIAGNOSIS — J3089 Other allergic rhinitis: Secondary | ICD-10-CM | POA: Diagnosis not present

## 2019-11-16 DIAGNOSIS — J309 Allergic rhinitis, unspecified: Secondary | ICD-10-CM | POA: Diagnosis not present

## 2019-12-06 ENCOUNTER — Ambulatory Visit: Payer: Self-pay | Admitting: Allergy and Immunology

## 2020-04-01 DIAGNOSIS — M25511 Pain in right shoulder: Secondary | ICD-10-CM | POA: Diagnosis not present

## 2020-04-01 DIAGNOSIS — F419 Anxiety disorder, unspecified: Secondary | ICD-10-CM | POA: Diagnosis not present

## 2020-04-01 DIAGNOSIS — M546 Pain in thoracic spine: Secondary | ICD-10-CM | POA: Diagnosis not present

## 2020-04-01 DIAGNOSIS — N6489 Other specified disorders of breast: Secondary | ICD-10-CM | POA: Diagnosis not present

## 2020-04-01 DIAGNOSIS — M25512 Pain in left shoulder: Secondary | ICD-10-CM | POA: Diagnosis not present

## 2020-04-01 DIAGNOSIS — R002 Palpitations: Secondary | ICD-10-CM | POA: Diagnosis not present

## 2020-04-01 DIAGNOSIS — G8929 Other chronic pain: Secondary | ICD-10-CM | POA: Diagnosis not present

## 2020-07-18 ENCOUNTER — Emergency Department (HOSPITAL_COMMUNITY)
Admission: EM | Admit: 2020-07-18 | Discharge: 2020-07-19 | Disposition: A | Discharge status: Home / Self Care | Payer: 59 | Attending: Emergency Medicine | Admitting: Emergency Medicine

## 2020-07-18 ENCOUNTER — Other Ambulatory Visit: Payer: Self-pay

## 2020-07-18 ENCOUNTER — Encounter (HOSPITAL_COMMUNITY): Payer: Self-pay | Admitting: *Deleted

## 2020-07-18 DIAGNOSIS — Z5321 Procedure and treatment not carried out due to patient leaving prior to being seen by health care provider: Secondary | ICD-10-CM | POA: Diagnosis not present

## 2020-07-18 DIAGNOSIS — M791 Myalgia, unspecified site: Secondary | ICD-10-CM | POA: Insufficient documentation

## 2020-07-18 DIAGNOSIS — R6883 Chills (without fever): Secondary | ICD-10-CM | POA: Diagnosis not present

## 2020-07-18 DIAGNOSIS — R059 Cough, unspecified: Secondary | ICD-10-CM | POA: Diagnosis not present

## 2020-07-18 LAB — COMPREHENSIVE METABOLIC PANEL
ALT: 18 U/L (ref 0–44)
AST: 16 U/L (ref 15–41)
Albumin: 3.9 g/dL (ref 3.5–5.0)
Alkaline Phosphatase: 44 U/L (ref 38–126)
Anion gap: 9 (ref 5–15)
BUN: 9 mg/dL (ref 6–20)
CO2: 26 mmol/L (ref 22–32)
Calcium: 9.3 mg/dL (ref 8.9–10.3)
Chloride: 103 mmol/L (ref 98–111)
Creatinine, Ser: 0.75 mg/dL (ref 0.44–1.00)
GFR, Estimated: >60 mL/min (ref >60)
Glucose, Bld: 89 mg/dL (ref 70–99)
Potassium: 4.2 mmol/L (ref 3.5–5.1)
Sodium: 138 mmol/L (ref 135–145)
Total Bilirubin: 0.6 mg/dL (ref 0.3–1.2)
Total Protein: 6.5 g/dL (ref 6.5–8.1)

## 2020-07-18 LAB — CBC
HCT: 39.7 % (ref 36.0–46.0)
Hemoglobin: 12.6 g/dL (ref 12.0–15.0)
MCH: 27.4 pg (ref 26.0–34.0)
MCHC: 31.7 g/dL (ref 30.0–36.0)
MCV: 86.3 fL (ref 80.0–100.0)
Platelets: 355 10*3/uL (ref 150–400)
RBC: 4.6 MIL/uL (ref 3.87–5.11)
RDW: 13.2 % (ref 11.5–15.5)
WBC: 9.5 10*3/uL (ref 4.0–10.5)
nRBC: 0 % (ref 0.0–0.2)

## 2020-07-18 LAB — I-STAT BETA HCG BLOOD, ED (MC, WL, AP ONLY): I-stat hCG, quantitative: <5 m[IU]/mL (ref <5)

## 2020-07-18 NOTE — ED Triage Notes (Signed)
Cold cough since this am with body aches and chills no temp  lmp dec 1st

## 2020-07-18 NOTE — ED Notes (Signed)
LWBS 

## 2020-09-24 ENCOUNTER — Encounter (HOSPITAL_COMMUNITY): Payer: Self-pay

## 2020-09-24 ENCOUNTER — Other Ambulatory Visit: Payer: Self-pay

## 2020-09-24 ENCOUNTER — Ambulatory Visit (HOSPITAL_COMMUNITY)
Admission: EM | Admit: 2020-09-24 | Discharge: 2020-09-24 | Disposition: A | Discharge status: Home / Self Care | Payer: Medicaid Other | Attending: Emergency Medicine | Admitting: Emergency Medicine

## 2020-09-24 ENCOUNTER — Ambulatory Visit (INDEPENDENT_AMBULATORY_CARE_PROVIDER_SITE_OTHER): Payer: Medicaid Other

## 2020-09-24 DIAGNOSIS — M7989 Other specified soft tissue disorders: Secondary | ICD-10-CM

## 2020-09-24 DIAGNOSIS — S92534A Nondisplaced fracture of distal phalanx of right lesser toe(s), initial encounter for closed fracture: Secondary | ICD-10-CM

## 2020-09-24 DIAGNOSIS — M79674 Pain in right toe(s): Secondary | ICD-10-CM | POA: Diagnosis not present

## 2020-09-24 DIAGNOSIS — S99821A Other specified injuries of right foot, initial encounter: Secondary | ICD-10-CM | POA: Diagnosis not present

## 2020-09-24 DIAGNOSIS — W228XXA Striking against or struck by other objects, initial encounter: Secondary | ICD-10-CM | POA: Diagnosis not present

## 2020-09-24 NOTE — Discharge Instructions (Signed)
Take ibuprofen as needed for discomfort.  Rest and elevate your toe.  Keep your toes buddy wrapped and wear the postop shoe as needed for comfort.  Follow up with an orthopedist if your symptoms are not improving.

## 2020-09-24 NOTE — ED Triage Notes (Signed)
Pt in with c/o right pinky toe pain and swelling that happened about 2 weeks ago when she hit her toe on a mirror.  Pt states her toe was really swollen and bruised

## 2020-09-24 NOTE — ED Provider Notes (Signed)
[facility]    CSN: [id number] Arrival date & time: 09/24/20  1639      History   Chief Complaint Chief Complaint  Patient presents with  . Toe Pain    HPI Gina Trujillo is a 30 y.o. female.   Patient presents with pain and swelling in her right fifth toe x2 weeks.  The pain started when she accidentally hit it on furniture.  She states initially the area was bruised and swollen.  No open wounds.  She reports ongoing pain and is unable to wear a shoe comfortably.  She denies numbness, weakness, paresthesias, open wounds, or other symptoms.  Her medical history includes asthma.  The history is provided by the patient and medical records.    Past Medical History:  Diagnosis Date  . Asthma     There are no problems to display for this patient.   History reviewed. No pertinent surgical history.  OB History   No obstetric history on file.      Home Medications    Prior to Admission medications   Medication Sig Start Date End Date Taking? Authorizing Provider  albuterol (PROVENTIL) (2.5 MG/3ML) 0.083% nebulizer solution Take 6 mLs (5 mg total) by nebulization every 6 (six) hours as needed for wheezing or shortness of breath. 08/13/14   Muthersbaugh, Dahlia Client, PA-C  fluconazole (DIFLUCAN) 100 MG tablet Take 1 tablet (100 mg total) by mouth once. Repeat dose in 48-72 hour. Patient not taking: Reported on 09/08/2016 08/06/15   Orvilla Cornwall A, CNM  Levonorgestrel-Ethinyl Estradiol (SEASONIQUE) 0.15-0.03 &0.01 MG tablet Take 1 tablet by mouth daily. Patient not taking: Reported on 08/06/2015 06/20/15   Orvilla Cornwall A, CNM  metroNIDAZOLE (FLAGYL) 500 MG tablet Take 1 tablet (500 mg total) by mouth 2 (two) times daily. Patient not taking: Reported on 09/08/2016 08/12/15   Roe Coombs, CNM  phentermine (ADIPEX-P) 37.5 MG tablet TK 1 T PO QD 09/04/16   [provider]  venlafaxine XR (EFFEXOR-XR) 75 MG 24 hr capsule Take 75 mg by mouth daily with breakfast.     [provider]    Family History Family History  Problem Relation Age of Onset  . Heart disease Father     Social History Social History   Tobacco Use  . Smoking status: Never Smoker  . Smokeless tobacco: Never Used  Substance Use Topics  . Alcohol use: Yes    Alcohol/week: 0.0 standard drinks    Comment: Socially  . Drug use: No     Allergies   Prednisone   Review of Systems Review of Systems  Constitutional: Negative for chills and fever.  HENT: Negative for ear pain and sore throat.   Eyes: Negative for pain and visual disturbance.  Respiratory: Negative for cough and shortness of breath.   Cardiovascular: Negative for chest pain and palpitations.  Gastrointestinal: Negative for abdominal pain and vomiting.  Genitourinary: Negative for dysuria and hematuria.  Musculoskeletal: Positive for arthralgias. Negative for back pain.  Skin: Negative for color change and rash.  Neurological: Negative for syncope, weakness and numbness.  All other systems reviewed and are negative.    Physical Exam Triage Vital Signs ED Triage Vitals  Enc Vitals Group     BP      Pulse      Resp      Temp      Temp src      SpO2      Weight      Height  Head Circumference      Peak Flow      Pain Score      Pain Loc      Pain Edu?      Excl. in GC?    No data found.  Updated Vital Signs BP (!) 141/99   Pulse 77   Temp 98.2 F (36.8 C)   Resp 18   LMP 09/15/2020 (Approximate)   SpO2 99%   Visual Acuity Right Eye Distance:   Left Eye Distance:   Bilateral Distance:    Right Eye Near:   Left Eye Near:    Bilateral Near:     Physical Exam Vitals and nursing note reviewed.  Constitutional:      General: She is not in acute distress.    Appearance: She is well-developed and well-nourished.  HENT:     Head: Normocephalic and atraumatic.     Mouth/Throat:     Mouth: Mucous membranes are moist.  Eyes:     Conjunctiva/sclera: Conjunctivae  normal.  Cardiovascular:     Rate and Rhythm: Normal rate and regular rhythm.     Heart sounds: Normal heart sounds.  Pulmonary:     Effort: Pulmonary effort is normal. No respiratory distress.     Breath sounds: Normal breath sounds.  Abdominal:     Palpations: Abdomen is soft.     Tenderness: There is no abdominal tenderness.  Musculoskeletal:        General: Swelling and tenderness present. No edema. Normal range of motion.     Cervical back: Neck supple.       Feet:  Skin:    General: Skin is warm and dry.     Findings: No bruising, erythema, lesion or rash.  Neurological:     General: No focal deficit present.     Mental Status: She is alert and oriented to person, place, and time.     Sensory: No sensory deficit.     Motor: No weakness.     Gait: Gait normal.  Psychiatric:        Mood and Affect: Mood and affect and mood normal.        Behavior: Behavior normal.      UC Treatments / Results  Labs (all labs ordered are listed, but only abnormal results are displayed) Labs Reviewed - No data to display  EKG   Radiology DG Toe 5th Right  Result Date: 09/24/2020 CLINICAL DATA:  Stubbed fifth toe 2 weeks ago with persistent pain and swelling, initial encounter EXAM: RIGHT FIFTH TOE COMPARISON:  None. FINDINGS: Mild irregularity is noted at the distal interphalangeal joint which may represent fracture of prior fused phalanges. Mild soft tissue swelling is noted. No other focal abnormality is noted. IMPRESSION: Distal fifth toe fracture. Electronically Signed   By: Alcide Clever M.D.   On: 09/24/2020 17:40    Procedures Procedures (including critical care time)  Medications Ordered in UC Medications - No data to display  Initial Impression / Assessment and Plan / UC Course  I have reviewed the triage vital signs and the nursing notes.  Pertinent labs & imaging results that were available during my care of the patient were reviewed by me and considered in my medical  decision making (see chart for details).   Closed nondisplaced fracture of right fifth toe.  Treating with ibuprofen, rest, elevation, ice packs, buddy tape toes, postop shoe.  Instructed patient to follow-up with orthopedics if her symptoms are not improving.  She agrees  to plan of care.   Final Clinical Impressions(s) / UC Diagnoses   Final diagnoses:  Closed nondisplaced fracture of distal phalanx of lesser toe of right foot, initial encounter     Discharge Instructions     Take ibuprofen as needed for discomfort.  Rest and elevate your toe.  Keep your toes buddy wrapped and wear the postop shoe as needed for comfort.  Follow up with an orthopedist if your symptoms are not improving.       ED Prescriptions    None     PDMP not reviewed this encounter.   Mickie Bail, NP 09/24/20 1754

## 2022-01-05 ENCOUNTER — Ambulatory Visit (INDEPENDENT_AMBULATORY_CARE_PROVIDER_SITE_OTHER): Payer: Medicaid Other

## 2022-01-05 ENCOUNTER — Ambulatory Visit
Admission: EM | Admit: 2022-01-05 | Discharge: 2022-01-05 | Disposition: A | Discharge status: Home / Self Care | Payer: Medicaid Other | Attending: Family Medicine | Admitting: Family Medicine

## 2022-01-05 DIAGNOSIS — R059 Cough, unspecified: Secondary | ICD-10-CM | POA: Diagnosis not present

## 2022-01-05 DIAGNOSIS — J4541 Moderate persistent asthma with (acute) exacerbation: Secondary | ICD-10-CM

## 2022-01-05 DIAGNOSIS — R062 Wheezing: Secondary | ICD-10-CM | POA: Diagnosis not present

## 2022-01-05 MED ORDER — PREDNISONE 20 MG PO TABS: 40.0000 mg | ORAL_TABLET | Freq: Every day | ORAL | 0 refills | Status: AC

## 2022-01-05 NOTE — ED Triage Notes (Signed)
Pt presents to uc with c/o persistent cough for 2 weeks, pt has pmh of asthma and uses albuterol and Symbicort.

## 2022-01-05 NOTE — Discharge Instructions (Addendum)
Your chest x-ray did not show any infiltrate like pneumonia  Prednisone 20 mg--'s 2 daily for 5 days

## 2022-01-05 NOTE — ED Provider Notes (Signed)
[facility]    CSN: [id number] Arrival date & time: 01/05/22  1817      History   Chief Complaint Chief Complaint  Patient presents with   Asthma    HPI Gina Trujillo is a 31 y.o. female.    Asthma   Here for a 2-week history of cough and congestion.  At first it was more like an upper respiratory infection/cold.  Now she is just left with a lot of cough sometimes and spasms and sometimes hard enough it makes her throw up.  She is also wheezing more than usual.  She usually uses Symbicort twice a day.  No fever recently  Past Medical History:  Diagnosis Date   Asthma     There are no problems to display for this patient.   Past Surgical History:  Procedure Laterality Date   BARIATRIC SURGERY      OB History   No obstetric history on file.      Home Medications    Prior to Admission medications   Medication Sig Start Date End Date Taking? Authorizing Provider  omeprazole (PRILOSEC) 40 MG capsule Take by mouth. 11/25/21  Yes [provider]  predniSONE (DELTASONE) 20 MG tablet Take 2 tablets (40 mg total) by mouth daily with breakfast for 5 days. 01/05/22 01/10/22 Yes Zenia Resides, MD  albuterol (PROVENTIL) (2.5 MG/3ML) 0.083% nebulizer solution Take 6 mLs (5 mg total) by nebulization every 6 (six) hours as needed for wheezing or shortness of breath. 08/13/14   Muthersbaugh, Dahlia Client, PA-C  Levonorgestrel-Ethinyl Estradiol (SEASONIQUE) 0.15-0.03 &0.01 MG tablet Take 1 tablet by mouth daily. Patient not taking: Reported on 08/06/2015 06/20/15   Orvilla Cornwall A, CNM  Multiple Vitamin (MULTIVITAMIN) capsule Take 1 capsule by mouth daily.    [provider]  phentermine (ADIPEX-P) 37.5 MG tablet TK 1 T PO QD 09/04/16   [provider]  ursodiol (ACTIGALL) 300 MG capsule Take 300 mg by mouth 2 (two) times daily. 11/25/21   [provider]  venlafaxine XR (EFFEXOR-XR) 75 MG 24 hr capsule Take 75 mg by mouth daily with  breakfast.    [provider]    Family History Family History  Problem Relation Age of Onset   Heart disease Father     Social History Social History   Tobacco Use   Smoking status: Never   Smokeless tobacco: Never  Substance Use Topics   Alcohol use: Yes    Alcohol/week: 0.0 standard drinks of alcohol    Comment: Socially   Drug use: No     Allergies   Prednisone   Review of Systems Review of Systems   Physical Exam Triage Vital Signs ED Triage Vitals  Enc Vitals Group     BP 01/05/22 1918 121/85     Pulse Rate 01/05/22 1918 80     Resp 01/05/22 1918 18     Temp 01/05/22 1918 97.9 F (36.6 C)     Temp Source 01/05/22 1918 Oral     SpO2 01/05/22 1918 98 %     Weight --      Height --      Head Circumference --      Peak Flow --      Pain Score 01/05/22 1915 0     Pain Loc --      Pain Edu? --      Excl. in GC? --    No data found.  Updated Vital Signs BP 121/85  Pulse 80   Temp 97.9 F (36.6 C) (Oral)   Resp 18   SpO2 98%   Visual Acuity Right Eye Distance:   Left Eye Distance:   Bilateral Distance:    Right Eye Near:   Left Eye Near:    Bilateral Near:     Physical Exam Vitals reviewed.  Constitutional:      General: She is not in acute distress.    Appearance: She is not toxic-appearing.  HENT:     Right Ear: Tympanic membrane and ear canal normal.     Left Ear: Tympanic membrane and ear canal normal.     Nose: Nose normal.     Mouth/Throat:     Mouth: Mucous membranes are moist.     Pharynx: No oropharyngeal exudate or posterior oropharyngeal erythema.  Eyes:     Extraocular Movements: Extraocular movements intact.     Conjunctiva/sclera: Conjunctivae normal.     Pupils: Pupils are equal, round, and reactive to light.  Cardiovascular:     Rate and Rhythm: Normal rate and regular rhythm.     Heart sounds: No murmur heard. Pulmonary:     Effort: Pulmonary effort is normal. No respiratory distress.     Breath  sounds: No stridor. No wheezing, rhonchi or rales.  Musculoskeletal:     Cervical back: Neck supple.  Lymphadenopathy:     Cervical: No cervical adenopathy.  Skin:    Capillary Refill: Capillary refill takes less than 2 seconds.     Coloration: Skin is not jaundiced or pale.  Neurological:     General: No focal deficit present.     Mental Status: She is alert and oriented to person, place, and time.  Psychiatric:        Behavior: Behavior normal.      UC Treatments / Results  Labs (all labs ordered are listed, but only abnormal results are displayed) Labs Reviewed - No data to display  EKG   Radiology DG Chest 2 View  Result Date: 01/05/2022 CLINICAL DATA:  Cough and wheezing for 2 weeks EXAM: CHEST - 2 VIEW COMPARISON:  08/12/2014 FINDINGS: Cardiac shadow is within normal limits. The lungs are well aerated bilaterally. Mild increased interstitial markings are seen without focal confluent infiltrate. No effusion is seen. No bony abnormality is noted. IMPRESSION: Mild increased interstitial changes which may be related to some bronchitis. No focal infiltrate is seen. Electronically Signed   By: Alcide Clever M.D.   On: 01/05/2022 19:45    Procedures Procedures (including critical care time)  Medications Ordered in UC Medications - No data to display  Initial Impression / Assessment and Plan / UC Course  I have reviewed the triage vital signs and the nursing notes.  Pertinent labs & imaging results that were available during my care of the patient were reviewed by me and considered in my medical decision making (see chart for details).     No infiltrate.  We will treat with prednisone for asthma exacerbation. Final Clinical Impressions(s) / UC Diagnoses   Final diagnoses:  Moderate persistent asthma with (acute) exacerbation     Discharge Instructions      Your chest x-ray did not show any infiltrate like pneumonia  Prednisone 20 mg--'s 2 daily for 5  days     ED Prescriptions     Medication Sig Dispense Auth. Provider   predniSONE (DELTASONE) 20 MG tablet Take 2 tablets (40 mg total) by mouth daily with breakfast for 5 days. 10 tablet Ka Bench,  Janace Aris, MD      PDMP not reviewed this encounter.   Zenia Resides, MD 01/05/22 [phone]

## 2022-04-04 ENCOUNTER — Other Ambulatory Visit: Payer: Self-pay

## 2022-04-04 ENCOUNTER — Emergency Department (HOSPITAL_COMMUNITY)
Admission: EM | Admit: 2022-04-04 | Discharge: 2022-04-04 | Discharge status: Against Medical Advice | Payer: Medicaid Other | Attending: Student | Admitting: Student

## 2022-04-04 ENCOUNTER — Encounter (HOSPITAL_COMMUNITY): Payer: Self-pay | Admitting: Emergency Medicine

## 2022-04-04 DIAGNOSIS — R42 Dizziness and giddiness: Secondary | ICD-10-CM | POA: Diagnosis not present

## 2022-04-04 DIAGNOSIS — Z5321 Procedure and treatment not carried out due to patient leaving prior to being seen by health care provider: Secondary | ICD-10-CM | POA: Insufficient documentation

## 2022-04-04 DIAGNOSIS — R55 Syncope and collapse: Secondary | ICD-10-CM | POA: Insufficient documentation

## 2022-04-04 DIAGNOSIS — R61 Generalized hyperhidrosis: Secondary | ICD-10-CM | POA: Diagnosis not present

## 2022-04-04 LAB — URINALYSIS, ROUTINE W REFLEX MICROSCOPIC
Bilirubin Urine: NEGATIVE
Glucose, UA: NEGATIVE mg/dL
Hgb urine dipstick: NEGATIVE
Ketones, ur: 20 mg/dL — AB
Leukocytes,Ua: NEGATIVE
Nitrite: NEGATIVE
Protein, ur: NEGATIVE mg/dL
Specific Gravity, Urine: 1.023 (ref 1.005–1.030)
pH: 5 (ref 5.0–8.0)

## 2022-04-04 LAB — I-STAT BETA HCG BLOOD, ED (MC, WL, AP ONLY): I-stat hCG, quantitative: <5 m[IU]/mL (ref <5)

## 2022-04-04 LAB — BASIC METABOLIC PANEL
Anion gap: 9 (ref 5–15)
BUN: 10 mg/dL (ref 6–20)
CO2: 23 mmol/L (ref 22–32)
Calcium: 9 mg/dL (ref 8.9–10.3)
Chloride: 105 mmol/L (ref 98–111)
Creatinine, Ser: 0.67 mg/dL (ref 0.44–1.00)
GFR, Estimated: >60 mL/min (ref >60)
Glucose, Bld: 109 mg/dL — ABNORMAL HIGH (ref 70–99)
Potassium: 3.3 mmol/L — ABNORMAL LOW (ref 3.5–5.1)
Sodium: 137 mmol/L (ref 135–145)

## 2022-04-04 LAB — CBC
HCT: 39.2 % (ref 36.0–46.0)
Hemoglobin: 13 g/dL (ref 12.0–15.0)
MCH: 29.1 pg (ref 26.0–34.0)
MCHC: 33.2 g/dL (ref 30.0–36.0)
MCV: 87.9 fL (ref 80.0–100.0)
Platelets: 409 10*3/uL — ABNORMAL HIGH (ref 150–400)
RBC: 4.46 MIL/uL (ref 3.87–5.11)
RDW: 12.1 % (ref 11.5–15.5)
WBC: 7.3 10*3/uL (ref 4.0–10.5)
nRBC: 0 % (ref 0.0–0.2)

## 2022-04-04 LAB — CBG MONITORING, ED: Glucose-Capillary: 101 mg/dL — ABNORMAL HIGH (ref 70–99)

## 2022-04-04 NOTE — ED Notes (Signed)
Pt expressed concerns to this RN about further treatment stating she feels fine and feels that symptoms were related to new gum she tried today. Pt verbalized understanding with risks and benefits of leaving prior to full evaluation by EDP. AMA paperwork signed and witnessed by this RN.

## 2022-04-04 NOTE — ED Triage Notes (Signed)
Pt found by ED triage staff to experience near syncopal episode. Pt became flushed and diaphoretic stating she felt extremely lightheaded. States she felt similar and brief episode earlier in the day that was not as severe. No hx of the same prior to today.

## 2023-01-26 ENCOUNTER — Other Ambulatory Visit: Payer: Self-pay | Admitting: Oncology

## 2023-01-26 DIAGNOSIS — Z006 Encounter for examination for normal comparison and control in clinical research program: Secondary | ICD-10-CM

## 2023-02-23 ENCOUNTER — Emergency Department (HOSPITAL_COMMUNITY)
Admission: EM | Admit: 2023-02-23 | Discharge: 2023-02-24 | Disposition: A | Discharge status: Home / Self Care | Payer: Medicaid Other | Attending: Emergency Medicine | Admitting: Emergency Medicine

## 2023-02-23 ENCOUNTER — Other Ambulatory Visit: Payer: Self-pay

## 2023-02-23 DIAGNOSIS — R079 Chest pain, unspecified: Secondary | ICD-10-CM | POA: Diagnosis present

## 2023-02-23 DIAGNOSIS — J45909 Unspecified asthma, uncomplicated: Secondary | ICD-10-CM | POA: Diagnosis not present

## 2023-02-23 DIAGNOSIS — R590 Localized enlarged lymph nodes: Secondary | ICD-10-CM | POA: Insufficient documentation

## 2023-02-23 NOTE — ED Triage Notes (Signed)
Patient coming to ED for evaluation of chest pain and tingling to extremities.  Reports symptoms started 2 hour PTA.  Has been having intermittent "issues" and has seen PCP without dx.  Having intermittent vaginal bleeding (not menstrual cycle related) and swollen lymph nodes.  Tonight chest pain and tingling are new symptoms.

## 2023-02-24 ENCOUNTER — Emergency Department (HOSPITAL_COMMUNITY): Payer: Medicaid Other

## 2023-02-24 LAB — D-DIMER, QUANTITATIVE: D-Dimer, Quant: <0.27 ug/mL-FEU (ref 0.00–0.50)

## 2023-02-24 MED ORDER — ACETAMINOPHEN 500 MG PO TABS
1000.0000 mg | ORAL_TABLET | Freq: Once | ORAL | Status: AC
  Administered 2023-02-24: 1000 mg via ORAL
  Filled 2023-02-24: qty 2

## 2023-02-24 MED ORDER — POTASSIUM CHLORIDE CRYS ER 20 MEQ PO TBCR
40.0000 meq | EXTENDED_RELEASE_TABLET | Freq: Once | ORAL | Status: AC
  Administered 2023-02-24: 40 meq via ORAL
  Filled 2023-02-24: qty 2

## 2023-02-24 NOTE — Discharge Instructions (Addendum)
Please take tylenol/ibuprofen for pain. I recommend close follow-up with cardiology and oncology for reevaluation.  Please do not hesitate to return to emergency department if worrisome signs symptoms we discussed become apparent.

## 2023-02-24 NOTE — ED Provider Notes (Signed)
[facility] Provider Note   CSN: [id number] Arrival date & time: 02/23/23  2259     History  Chief Complaint  Patient presents with   Chest Pain    Gina Trujillo is a 32 y.o. female history of asthma presented today for evaluation of chest pain.  States that she started to have chest pain around 9 pm today.  Pain is in the center of her chest and radiates to the right side of her back near the right shoulder blade.  Pain is worse when she raises her right arm up.  She also said that she has been having swelling lymph nodes on the right side of her neck in the last 2 months.  States in the last 2 months she also have intermittent night sweats and unable to gain weight.  Denies any cough, runny nose, fever, nausea or vomiting.  Patient is also complaining about 2 episodes of vaginal bleeding.  Last menstrual cycle was on 02/02/2023.  Reports spotting bright red blood.   Chest Pain     Past Medical History:  Diagnosis Date   Asthma    Past Surgical History:  Procedure Laterality Date   BARIATRIC SURGERY       Home Medications Prior to Admission medications   Medication Sig Start Date End Date Taking? Authorizing Provider  albuterol (PROVENTIL) (2.5 MG/3ML) 0.083% nebulizer solution Take 6 mLs (5 mg total) by nebulization every 6 (six) hours as needed for wheezing or shortness of breath. Patient not taking: Reported on 02/24/2023 08/13/14   Muthersbaugh, Dahlia Client, PA-C  buPROPion (WELLBUTRIN XL) 150 MG 24 hr tablet Take 150 mg by mouth daily. Patient not taking: Reported on 02/24/2023 01/25/23   [provider]  buPROPion (WELLBUTRIN XL) 300 MG 24 hr tablet Take 300 mg by mouth daily. Patient not taking: Reported on 02/24/2023 01/27/23   [provider]  escitalopram (LEXAPRO) 10 MG tablet Take 10 mg by mouth daily. Patient not taking: Reported on 02/24/2023 02/15/23   [provider]  guanFACINE (INTUNIV) 1 MG TB24 ER  tablet Take 1 mg by mouth daily. Patient not taking: Reported on 02/24/2023 02/22/23   [provider]  Multiple Vitamin (MULTIVITAMIN) capsule Take 1 capsule by mouth daily. Patient not taking: Reported on 02/24/2023    [provider]  omeprazole (PRILOSEC) 40 MG capsule Take by mouth. Patient not taking: Reported on 02/24/2023 11/25/21   [provider]  venlafaxine XR (EFFEXOR-XR) 75 MG 24 hr capsule Take 75 mg by mouth daily with breakfast. Patient not taking: Reported on 02/24/2023    [provider]      Allergies    Prednisone    Review of Systems   Review of Systems  Cardiovascular:  Positive for chest pain.    Physical Exam Updated Vital Signs BP (!) 138/101 (BP Location: Left Arm)   Pulse (!) 110   Temp 98.3 F (36.8 C) (Oral)   Resp 17   Ht 5\' 2"  (1.575 m)   Wt 54.4 kg   SpO2 100%   BMI 21.95 kg/m  Physical Exam Vitals and nursing note reviewed.  Constitutional:      Appearance: Normal appearance.  HENT:     Head: Normocephalic and atraumatic.     Mouth/Throat:     Mouth: Mucous membranes are moist.  Eyes:     General: No scleral icterus. Cardiovascular:     Rate and Rhythm: Normal rate and regular rhythm.  Pulses: Normal pulses.     Heart sounds: Normal heart sounds.  Pulmonary:     Effort: Pulmonary effort is normal.     Breath sounds: Normal breath sounds.  Abdominal:     General: Abdomen is flat.     Palpations: Abdomen is soft.     Tenderness: There is no abdominal tenderness.  Musculoskeletal:        General: No deformity.  Lymphadenopathy:     Cervical: Cervical adenopathy present.  Skin:    General: Skin is warm.     Findings: No rash.  Neurological:     General: No focal deficit present.     Mental Status: She is alert.  Psychiatric:        Mood and Affect: Mood normal.     ED Results / Procedures / Treatments   Labs (all labs ordered are listed, but only abnormal results are displayed) Labs Reviewed   CBC WITH DIFFERENTIAL/PLATELET  BASIC METABOLIC PANEL  HCG, SERUM, QUALITATIVE  TROPONIN I (HIGH SENSITIVITY)    EKG EKG Interpretation Date/Time:  Wednesday February 23 2023 23:10:09 EDT Ventricular Rate:  88 PR Interval:  131 QRS Duration:  93 QT Interval:  358 QTC Calculation: 434 R Axis:   87  Text Interpretation: Sinus rhythm No significant change was found Confirmed by Glynn Octave [phone]) on 02/24/2023 12:30:34 AM  Radiology No results found.  Procedures Procedures    Medications Ordered in ED Medications - No data to display  ED Course/ Medical Decision Making/ A&P                                 Medical Decision Making Amount and/or Complexity of Data Reviewed Labs: ordered. Radiology: ordered.  Risk OTC drugs. Prescription drug management.   This patient presents to the ED for chest pain, this involves an extensive number of treatment options, and is a complaint that carries with a high risk of complications and morbidity.  The differential diagnosis includes ACS, pericarditis, PE, pneumothorax, pneumonia, less likely dissection with essentially normal blood pressure, symmetric bilateral pulses, and no back pain.  This is not an exhaustive list.  Lab tests: I ordered and personally interpreted labs.  The pertinent results include: WBC unremarkable. Hbg unremarkable. Platelets unremarkable. Electrolytes unremarkable. BUN, creatinine unremarkable. Troponin negative.   Imaging studies: I ordered imaging studies, personally reviewed, interpreted imaging and agree with the radiologist's interpretations. The results include: Chest x-ray is unremarkable.  Problem list/ ED course/ Critical interventions/ Medical management: HPI: See above Vital signs within normal range and stable throughout visit. Laboratory/imaging studies significant for: See above. On physical examination, patient is afebrile and appears in no acute distress.  There was cervical  lymphadenopathy noted to the right side of the neck.  EKG without signs of active ischemia.  First troponin was 3, chest pain subsided after Tylenol, doubt NSTEMI.  Presentation not consistent with acute PE, patient is Wells low risk and D-dimer was negative.  Chest x-ray showed no pneumothorax or pneumonia.  Unlikely pericarditis or myocarditis, patient has no recent illness, troponin was negative.  Given cervical lymphadenopathy and night sweats will refer patient to oncology.  Heart score is 1 so plan to discharge patient home with cardiology follow-up. I have reviewed the patient home medicines and have made adjustments as needed.  Cardiac monitoring/EKG: The patient was maintained on a cardiac monitor.  I personally reviewed and interpreted the cardiac monitor which showed  an underlying rhythm of: sinus rhythm.  Additional history obtained: External records from outside source obtained and reviewed including: Chart review including previous notes, labs, imaging.  Consultations obtained:  Disposition Continued outpatient therapy. Follow-up with cardiology and oncology recommended for reevaluation of symptoms. Treatment plan discussed with patient.  Pt acknowledged understanding was agreeable to the plan. Worrisome signs and symptoms were discussed with patient, and patient acknowledged understanding to return to the ED if they noticed these signs and symptoms. Patient was stable upon discharge.   This chart was dictated using voice recognition software.  Despite best efforts to proofread,  errors can occur which can change the documentation meaning.          Final Clinical Impression(s) / ED Diagnoses Final diagnoses:  Chest pain, unspecified type    Rx / DC Orders ED Discharge Orders          Ordered    Ambulatory referral to Cardiology       Comments: If you have not heard from the Cardiology office within the next 72 hours please call [phone].   02/24/23 0340               Jeanelle Malling, PA 02/24/23 Asencion Noble, MD 02/24/23 [phone]

## 2023-06-11 ENCOUNTER — Ambulatory Visit
Admission: EM | Admit: 2023-06-11 | Discharge: 2023-06-11 | Disposition: A | Discharge status: Home / Self Care | Payer: Medicaid Other | Attending: Internal Medicine | Admitting: Internal Medicine

## 2023-06-11 DIAGNOSIS — H6992 Unspecified Eustachian tube disorder, left ear: Secondary | ICD-10-CM | POA: Diagnosis not present

## 2023-06-11 MED ORDER — PSEUDOEPHEDRINE HCL 60 MG PO TABS: 60.0000 mg | ORAL_TABLET | Freq: Three times a day (TID) | ORAL | 0 refills | Status: DC | PRN

## 2023-06-11 MED ORDER — CETIRIZINE HCL 10 MG PO TABS: 10.0000 mg | ORAL_TABLET | Freq: Every day | ORAL | 0 refills | Status: DC

## 2023-06-11 MED ORDER — FLUTICASONE PROPIONATE 50 MCG/ACT NA SUSP: 2.0000 | Freq: Every day | NASAL | 0 refills | Status: DC

## 2023-06-11 NOTE — ED Triage Notes (Signed)
Pt reports hearing decrease left era x 1 month.Tried ear drops without relief. Used a camera in the ear and cant see anything.

## 2023-06-11 NOTE — ED Provider Notes (Signed)
[facility]  Note:  This document was prepared using Conservation officer, historic buildings and may include unintentional dictation errors.  MRN: [id number] DOB: September 27, 1990  Subjective:   Gina Trujillo is a 32 y.o. female presenting for 1 month history of persistent left ear fullness, decreased hearing.  Has used eardrops without relief.  Cannot appreciate any earwax.  No fever, ear drainage, tinnitus, ear pain.  No current facility-administered medications for this encounter.  Current Outpatient Medications:    albuterol (PROVENTIL) (2.5 MG/3ML) 0.083% nebulizer solution, Take 6 mLs (5 mg total) by nebulization every 6 (six) hours as needed for wheezing or shortness of breath. (Patient not taking: Reported on 02/24/2023), Disp: 75 mL, Rfl: 12   buPROPion (WELLBUTRIN XL) 150 MG 24 hr tablet, Take 150 mg by mouth daily. (Patient not taking: Reported on 02/24/2023), Disp: , Rfl:    buPROPion (WELLBUTRIN XL) 300 MG 24 hr tablet, Take 300 mg by mouth daily. (Patient not taking: Reported on 02/24/2023), Disp: , Rfl:    escitalopram (LEXAPRO) 10 MG tablet, Take 10 mg by mouth daily. (Patient not taking: Reported on 02/24/2023), Disp: , Rfl:    guanFACINE (INTUNIV) 1 MG TB24 ER tablet, Take 1 mg by mouth daily. (Patient not taking: Reported on 02/24/2023), Disp: , Rfl:    Multiple Vitamin (MULTIVITAMIN) capsule, Take 1 capsule by mouth daily. (Patient not taking: Reported on 02/24/2023), Disp: , Rfl:    omeprazole (PRILOSEC) 40 MG capsule, Take by mouth. (Patient not taking: Reported on 02/24/2023), Disp: , Rfl:    venlafaxine XR (EFFEXOR-XR) 75 MG 24 hr capsule, Take 75 mg by mouth daily with breakfast. (Patient not taking: Reported on 02/24/2023), Disp: , Rfl:    Allergies  Allergen Reactions   Prednisone     Sharp abdominal pain, diarrhea    Past Medical History:  Diagnosis Date   Asthma      Past Surgical History:  Procedure Laterality Date   BARIATRIC SURGERY      Family  History  Problem Relation Age of Onset   Heart disease Father     Social History   Tobacco Use   Smoking status: Never   Smokeless tobacco: Never  Substance Use Topics   Alcohol use: Yes    Alcohol/week: 0.0 standard drinks of alcohol    Comment: Socially   Drug use: No    ROS   Objective:   Vitals: BP (!) 134/92 (BP Location: Left Arm)   Pulse 92   Temp 98.2 F (36.8 C) (Oral)   Resp 16   LMP 05/23/2023 (Exact Date)   SpO2 98%   Physical Exam Constitutional:      General: She is not in acute distress.    Appearance: Normal appearance. She is well-developed and normal weight. She is not ill-appearing, toxic-appearing or diaphoretic.  HENT:     Head: Normocephalic and atraumatic.     Right Ear: Tympanic membrane, ear canal and external ear normal. No drainage or tenderness. No middle ear effusion. There is no impacted cerumen. Tympanic membrane is not erythematous or bulging.     Left Ear: Tympanic membrane, ear canal and external ear normal. No drainage or tenderness.  No middle ear effusion. There is no impacted cerumen. Tympanic membrane is not erythematous or bulging.     Nose: No congestion or rhinorrhea.     Mouth/Throat:     Mouth: Mucous membranes are moist. No oral lesions.     Pharynx: No pharyngeal swelling, oropharyngeal exudate,  posterior oropharyngeal erythema or uvula swelling.     Tonsils: No tonsillar exudate or tonsillar abscesses.  Eyes:     General: No scleral icterus.       Right eye: No discharge.        Left eye: No discharge.     Extraocular Movements: Extraocular movements intact.     Right eye: Normal extraocular motion.     Left eye: Normal extraocular motion.     Conjunctiva/sclera: Conjunctivae normal.  Cardiovascular:     Rate and Rhythm: Normal rate.  Pulmonary:     Effort: Pulmonary effort is normal.  Musculoskeletal:     Cervical back: Normal range of motion and neck supple.  Lymphadenopathy:     Cervical: No cervical  adenopathy.  Skin:    General: Skin is warm and dry.  Neurological:     General: No focal deficit present.     Mental Status: She is alert and oriented to person, place, and time.  Psychiatric:        Mood and Affect: Mood normal.        Behavior: Behavior normal.     Assessment and Plan :   PDMP not reviewed this encounter.  1. Eustachian tube dysfunction, left    Unremarkable ENT exam.  Will use conservative management for what I suspect is eustachian tube dysfunction.  Recommended starting Flonase, Zyrtec, Sudafed.  Follow-up with an ENT specialist if symptoms persist.  Counseled patient on potential for adverse effects with medications prescribed/recommended today, ER and return-to-clinic precautions discussed, patient verbalized understanding.    Wallis Bamberg, [place] 06/11/23 1554

## 2023-11-22 ENCOUNTER — Other Ambulatory Visit: Payer: Self-pay

## 2023-11-22 ENCOUNTER — Encounter (HOSPITAL_COMMUNITY): Payer: Self-pay | Admitting: Emergency Medicine

## 2023-11-22 ENCOUNTER — Emergency Department (HOSPITAL_COMMUNITY)
Admission: EM | Admit: 2023-11-22 | Discharge: 2023-11-22 | Disposition: A | Discharge status: Home / Self Care | Attending: Emergency Medicine | Admitting: Emergency Medicine

## 2023-11-22 DIAGNOSIS — R440 Auditory hallucinations: Secondary | ICD-10-CM | POA: Diagnosis present

## 2023-11-22 DIAGNOSIS — F430 Acute stress reaction: Secondary | ICD-10-CM | POA: Insufficient documentation

## 2023-11-22 DIAGNOSIS — R443 Hallucinations, unspecified: Secondary | ICD-10-CM

## 2023-11-22 HISTORY — DX: Depression, unspecified: F32.A

## 2023-11-22 HISTORY — DX: Anxiety disorder, unspecified: F41.9

## 2023-11-22 LAB — ETHANOL: Alcohol, Ethyl (B): <15 mg/dL (ref <15)

## 2023-11-22 LAB — COMPREHENSIVE METABOLIC PANEL WITH GFR
ALT: 13 U/L (ref 0–44)
AST: 18 U/L (ref 15–41)
Albumin: 3.6 g/dL (ref 3.5–5.0)
Alkaline Phosphatase: 39 U/L (ref 38–126)
Anion gap: 10 (ref 5–15)
BUN: 14 mg/dL (ref 6–20)
CO2: 23 mmol/L (ref 22–32)
Calcium: 8.8 mg/dL — ABNORMAL LOW (ref 8.9–10.3)
Chloride: 105 mmol/L (ref 98–111)
Creatinine, Ser: 0.85 mg/dL (ref 0.44–1.00)
GFR, Estimated: >60 mL/min (ref >60)
Glucose, Bld: 96 mg/dL (ref 70–99)
Potassium: 3.5 mmol/L (ref 3.5–5.1)
Sodium: 138 mmol/L (ref 135–145)
Total Bilirubin: 0.8 mg/dL (ref 0.0–1.2)
Total Protein: 6.3 g/dL — ABNORMAL LOW (ref 6.5–8.1)

## 2023-11-22 LAB — RAPID URINE DRUG SCREEN, HOSP PERFORMED
Amphetamines: POSITIVE — AB
Barbiturates: NOT DETECTED
Benzodiazepines: NOT DETECTED
Cocaine: NOT DETECTED
Opiates: NOT DETECTED
Tetrahydrocannabinol: NOT DETECTED

## 2023-11-22 LAB — CBC
HCT: 40.9 % (ref 36.0–46.0)
Hemoglobin: 13.9 g/dL (ref 12.0–15.0)
MCH: 30.1 pg (ref 26.0–34.0)
MCHC: 34 g/dL (ref 30.0–36.0)
MCV: 88.5 fL (ref 80.0–100.0)
Platelets: 468 10*3/uL — ABNORMAL HIGH (ref 150–400)
RBC: 4.62 MIL/uL (ref 3.87–5.11)
RDW: 12.3 % (ref 11.5–15.5)
WBC: 6.4 10*3/uL (ref 4.0–10.5)
nRBC: 0 % (ref 0.0–0.2)

## 2023-11-22 LAB — HCG, SERUM, QUALITATIVE: Preg, Serum: NEGATIVE

## 2023-11-22 MED ORDER — ESCITALOPRAM OXALATE 10 MG PO TABS: 10.0000 mg | ORAL_TABLET | Freq: Every day | ORAL | 0 refills | Status: AC

## 2023-11-22 MED ORDER — HYDROXYZINE HCL 10 MG PO TABS: 10.0000 mg | ORAL_TABLET | Freq: Three times a day (TID) | ORAL | 0 refills | Status: AC | PRN

## 2023-11-22 NOTE — Discharge Instructions (Addendum)
 Please continue to take the Lexapro and hydroxyzine but stop the Wellbutrin.  Please follow-up with the psychiatric providers listed below and I have attached a primary care provider you can follow-up with as well.  If symptoms change or worsen please return to the ER.  A referral has been made for [facility], and they should be in contact with you in the next 24 hours to set up an appointment.   You have also been referred to the intensive outpatient program through [facility]. You will be getting a call today or tomorrow to discuss this option further.    Outpatient psychiatric Services  Walk in hours for medication management Monday, Wednesday, Thursday, and Friday from 8:00 AM to 11:00 AM Recommend arriving by by 7:30 AM.  It is first come first serve.    Walk in hours for therapy intake Monday and Wednesday only 8:00 AM to 11:00 AM Encouraged to arrive by 7:30 AM.  It is first come first serve   Inpatient patient psychiatric services [facility] offers comprehensive behavioral heath care services for mental health and substance abuse treatment.  Social work can also assist with referral to or getting you into a rehabilitation program short or long term

## 2023-11-22 NOTE — ED Triage Notes (Signed)
 Pt reports feeling extremely overwhelmed and paranoid over the weekend. Pt endorses auditory and visual hallucinations. Pt taking depression and anxiety meds. Denies SI plans. Pt reports feeling off.

## 2023-11-22 NOTE — ED Notes (Signed)
 Provider at bedside

## 2023-11-22 NOTE — ED Provider Notes (Cosign Needed Addendum)
  EMERGENCY DEPARTMENT AT [facility] Provider Note   CSN: [id number] Arrival date & time: 11/22/23  1044     History  Chief Complaint  Patient presents with   Depression    Gina Trujillo is a 33 y.o. female.  Patient reports this past weekend she had episodes of hallucinations.  Patient reports that she had auditory hallucinations.  She states that she had an episode of texting gibberish to family members that she does not recall doing.  Patient reports that she has been having episodes of paranoia.  Patient states that she thinks someone that she knows is stalking her and gets caught up in thinking that and feels like she loses touch with reality.  Patient states she currently feels like she is back to reality.  Patient states her family is very concerned about her and wanted her to come to the emergency department.  Patient states she feels like there is something wrong with her that she does not know what it is.  Patient states that she thinks she might be bipolar or borderline.  Patient reports that she recently has been under a lot of stress.  Patient denies any current hallucinations.  Patient does drink alcohol she smokes marijuana and occasionally does ecstasy.  She does not smoke cigarettes.  Patient had previously been followed by a counselor in the [facility] system and by a psychiatrist. Patient denies any suicidal or homicidal thoughts.  The history is provided by the patient. No language interpreter was used.  Depression       Home Medications Prior to Admission medications   Medication Sig Start Date End Date Taking? Authorizing Provider  albuterol  (PROVENTIL ) (2.5 MG/3ML) 0.083% nebulizer solution Take 6 mLs (5 mg total) by nebulization every 6 (six) hours as needed for wheezing or shortness of breath. 08/13/14  Yes Muthersbaugh, Alisa App, PA-C  buPROPion (WELLBUTRIN XL) 300 MG 24 hr tablet Take 300 mg by mouth daily. 01/27/23  Yes [provider]   escitalopram (LEXAPRO) 10 MG tablet Take 10 mg by mouth at bedtime. 02/15/23  Yes [provider]  hydrOXYzine (ATARAX) 10 MG tablet Take 10 mg by mouth 3 (three) times daily as needed for anxiety.   Yes [provider]  Multiple Vitamin (MULTIVITAMIN) tablet Take 1 tablet by mouth daily.   Yes [provider]  Multiple Vitamins-Minerals (BARIATRIC MULTIVITAMINS PO) Take 1 tablet by mouth daily.   Yes [provider]      Allergies    Prednisone     Review of Systems   Review of Systems  Psychiatric/Behavioral:  Positive for depression.   All other systems reviewed and are negative.   Physical Exam Updated Vital Signs BP (!) 139/91 (BP Location: Right Arm)   Pulse 81   Temp 98.4 F (36.9 C) (Oral)   Resp 16   Ht 5\' 2"  (1.575 m)   Wt 54 kg   LMP 11/07/2023 (Approximate)   SpO2 100%   BMI 21.77 kg/m  Physical Exam Vitals and nursing note reviewed.  Constitutional:      Appearance: She is well-developed.  HENT:     Head: Normocephalic.  Cardiovascular:     Rate and Rhythm: Normal rate.  Pulmonary:     Effort: Pulmonary effort is normal.  Abdominal:     General: There is no distension.  Musculoskeletal:        General: Normal range of motion.     Cervical back: Normal range of motion.  Neurological:  Mental Status: She is alert and oriented to person, place, and time.     ED Results / Procedures / Treatments   Labs (all labs ordered are listed, but only abnormal results are displayed) Labs Reviewed  COMPREHENSIVE METABOLIC PANEL WITH GFR - Abnormal; Notable for the following components:      Result Value   Calcium 8.8 (*)    Total Protein 6.3 (*)    All other components within normal limits  CBC - Abnormal; Notable for the following components:   Platelets 468 (*)    All other components within normal limits  RAPID URINE DRUG SCREEN, HOSP PERFORMED - Abnormal; Notable for the following components:   Amphetamines POSITIVE  (*)    All other components within normal limits  ETHANOL  HCG, SERUM, QUALITATIVE    EKG None  Radiology No results found.  Procedures Procedures    Medications Ordered in ED Medications - No data to display  ED Course/ Medical Decision Making/ A&P                                 Medical Decision Making Pt complains of hallucinations and feeling like she is not in reality  Pt also reports wanting to fade away   Amount and/or Complexity of Data Reviewed Labs: ordered.    Details: Positive for amphetamine  Discussion of management or test interpretation with external provider(s): TTS consulted.  Patient reports having hallucinations.  Patient is not suicidal or homicidal.  Patient is here voluntary requesting help.           Final Clinical Impression(s) / ED Diagnoses Final diagnoses:  Hallucinations    Rx / DC Orders ED Discharge Orders     None         Sandi Crosby, PA-C 11/22/23 1344    Evelyn Hire 11/22/23 1510    Trish Furl, MD 11/23/23 [phone]

## 2023-11-22 NOTE — ED Provider Notes (Signed)
 Patient given in sign out by [place], PA-C.  Please review their note for patient HPI, physical exam, workup.  At this time the plan is to wait for TTS recommendations.  Patient is not currently having suicidal ideation or homicidal ideation nor endorsing auditory or visual hallucinations at this time so is not IVC per previous team.  Care discussed with Roise Cleaver, NP TTS  TTS is evaluate the patient states the patient is psychologically cleared and that they will put in resources in the discharge papers along with coordinating follow-up and if patient is safe to be discharged.  They recommend that she takes lexapro and hydroxyzine, and stop the wellbutrin.  Patient is made aware of this by TTS and myself.  Patient is in agreement with this plan.  Patient stable to be discharged at this time.  Patient verbalized understanding and acceptance of plan.  Patient given return precautions.   Denese Finn, PA-C 11/22/23 1554    Trish Furl, MD 11/23/23 [phone]

## 2023-11-22 NOTE — ED Notes (Addendum)
 Pt ambulating to bathroom at this time to provide UA.

## 2023-11-22 NOTE — Consult Note (Signed)
 [facility] Psychiatric Consult Initial  Patient Name: .Gina Trujillo  MRN: [id number]  DOB: [birth year]  Consult Order details:  Orders (From admission, onward)     Start     Ordered   11/22/23 1340  CONSULT TO CALL ACT TEAM       Comments: See notes  Ordering Provider: Sandi Crosby, PA-C  Provider:  (Not yet assigned)  Question:  Reason for Consult?  Answer:  consult   11/22/23 1339             Mode of Visit: In person    Psychiatry Consult Evaluation  Service Date: Nov 22, 2023 LOS:  LOS: 0 days  Chief Complaint stress reactions  Primary Psychiatric Diagnoses  Acute stress reaction 2.   3.    Assessment  Gina Trujillo is a 33 y.o. female admitted: Presented to the [facility] 11/22/2023 10:48 AM requesting mental health evaluation. She carries the psychiatric diagnoses of ADHD.   Current outpatient psychotropic medications include lexapro, wellbutrin and historically she has had a positive response to these medications. She was non compliant with medications prior to admission as evidenced by patient report. Since starting them again at full dose, she has had worsening mood swings and "stress reactions." On initial examination, patient is pleasant, cooperative. Please see plan below for detailed recommendations.   Diagnoses:  Active Hospital problems: Principal Problem:   Acute stress reaction    Plan   ## Psychiatric Medication Recommendations:  - Continue Lexapro 10 mg daily - Continue Hydroxyzine 10 mg TID PRN  - Discontinue Wellbutrin immediately   ## Medical Decision Making Capacity: Pt has capacity   ## Disposition:-- There are no psychiatric contraindications to discharge at this time  ## Behavioral / Environmental: - No specific recommendations at this time.     ## Safety and Observation Level:  - Based on my clinical evaluation, I estimate the patient to be at low risk of self harm in the current setting. - At this time, we recommend  routine. This  decision is based on my review of the chart including patient's history and current presentation, interview of the patient, mental status examination, and consideration of suicide risk including evaluating suicidal ideation, plan, intent, suicidal or self-harm behaviors, risk factors, and protective factors. This judgment is based on our ability to directly address suicide risk, implement suicide prevention strategies, and develop a safety plan while the patient is in the clinical setting. Please contact our team if there is a concern that risk level has changed.  CSSR Risk Category:C-SSRS RISK CATEGORY: Low Risk  Suicide Risk Assessment: Patient has following modifiable risk factors for suicide: medication noncompliance, which we are addressing by encouraging compliance. Patient has following non-modifiable or demographic risk factors for suicide: separation or divorce Patient has the following protective factors against suicide: Access to outpatient mental health care, Supportive family, Supportive friends, Cultural, spiritual, or religious beliefs that discourage suicide, Minor children in the home, Frustration tolerance, no history of suicide attempts, and no history of NSSIB  Thank you for this consult request. Recommendations have been communicated to the primary team.  We will psych clear for discharge at this time.   Roise Cleaver, NP       History of Present Illness  Relevant Aspects of Hospital ED Course:  Admitted on 11/22/2023 Patient reports this past weekend she had episodes of hallucinations. Patient reports that she had auditory hallucinations. She states that she had an episode of texting gibberish to family members  that she does not recall doing. Patient reports that she has been having episodes of paranoia. Patient states that she thinks someone that she knows is stalking her and gets caught up in thinking that and feels like she loses touch with reality. Patient states she  currently feels like she is back to reality. Patient states her family is very concerned about her and wanted her to come to the emergency department. Patient states she feels like there is something wrong with her that she does not know what it is. Patient states that she thinks she might be bipolar or borderline. Patient reports that she recently has been under a lot of stress. Patient denies any current hallucinations. Patient does drink alcohol she smokes marijuana and occasionally does ecstasy. She does not smoke cigarettes. Patient had previously been followed by a counselor in the [facility] system and by a psychiatrist. Patient denies any suicidal or homicidal thoughts.   Patient Report:  Patient seen at [facility] ED for face-to-face psychiatric evaluation.  Patient states she has been undergoing a lot of stress in the past year, and feels like she needs to get reestablished with therapy and psychiatry services.  This past weekend specifically she felt " crazy" and decided to come to the ED today for evaluation and help on setting up outpatient resources.  Patient endorses psychiatric history of ADHD and MDD.  In the past patient prescribed Lexapro, Wellbutrin, and hydroxyzine.  Patient stopped taking these medications around 8 months ago.  Patient stated she married her high school sweetheart, and they have been married for 8 years.  They have children together.  However the patient stated she realized she was a lesbian and came out to her husband over 6 months ago. It has been stressful at home trying to navigate a huge life change, separation, and the children.  Patient believes this is when her stress reactions likely started occurring.  She even mentions during this time of her now ex-husband being extremely upset and she felt like she had no emotions and did not even cry during this time.  She now feels terrible for how she acted, she felt like she was heartless and had no empathy.  Patient describes  these stress reactions as dissociating from herself.  However she sometimes does bizarre things like this weekend where she felt extremely paranoid and like the woman she is dating was sending subliminal messages through social media.  Patient stated she remembers seeing screenshots, went to sleep, and then woke up realizing "how stupid all of that was." She stated looking back at the screen shots, she feels "crazy" over thinking that paranoid. Pt stated "what I was thinking was so far from the truth maybe I was hallucinating yesterday. Pt denies ever experiencing any auditory hallucinations. Denies VH hallucinations. She denies problems with sleep or appetite. States she averages a normal 6-8 per night. She denies SI. Denies any previous suicide attempts through out life. Pt states she has "way too much to live for" such as her children, family, friends, work. Pt did express, she started taking her left over Wellbutrin and Lexapro again, and does feel like she has worsened since that. I explained it likely was too high of a dose to start taking all at once again, which could cause some of these effects. She also mentions ocassional drinking and THC use, as well as occasional ecstasy. Also explained to patient illicit substances can worsen these episodes and to refrain.   Pt was  offered inpatient admission, however she declines. She denies SI/HI/AVH. She stated her purpose of coming to ED today was for medication advise and outpatient resources. She is wanting to get reestablished with psychiatrist and therapist but is unsure where to start. Instructed patient to continue with Lexapro 10 mg daily as she feels this was successful in the past. Continue Hydroxyzine 10 mg TID PRN for anxiety. Will discontinue Wellbutrin at this time, due to patient likely having acute stress reactions, and I do not think Wellbutrin is beneficial at this time with its stimulating properties. Explained returned precautions. We also  discussed other resources available to her such as [facility], spoke about the 24/7 urgent care as well as walk in appointments she can utilize. I have also sent in a referral to [facility] for therapy and medication management. They will be contacting her within 24 hours to set up appointments.   During evaluation Gina Trujillo is sitting in no acute distress.  She is alert, oriented x 4, calm, cooperative and attentive.  Her mood is euthymic with congruent affect.  She has normal speech, and behavior.  Objectively there is no evidence of psychosis/mania or delusional thinking.  Patient is able to converse coherently, goal directed thoughts, no distractibility, or pre-occupation.  She also denies suicidal/self-harm/homicidal ideation, psychosis, and paranoia.  Patient answered question appropriately.   While future psychiatric events cannot be accurately predicted, the patient does not currently require further acute psychiatric care and does not currently meet [facility]  involuntary commitment criteria. It is recommended that the patient continue treatment in outpatient care. A follow up plan and crisis plan are in place, have been discussed with the patient, and the patient agrees to the plan at time of discharge.    Pt has declined wanting family contacted.  Review of Systems  Psychiatric/Behavioral:  Positive for substance abuse.        Acute stress reactions  All other systems reviewed and are negative.    Psychiatric and Social History  Psychiatric History:  Information collected from patient, chart  Prev Dx/Sx: MDD, ADHD Current Psych Provider: denies Home Meds (current): lexapro, hydroxyzine Previous Med Trials: wellbutrin Therapy: denies  Prior Psych Hospitalization: denies  Prior Self Harm: denies Prior Violence: denies  Access to weapons/lethal means: denies   Substance History Alcohol: occasional  Tobacco: denies Illicit drugs: thc, ecstacy Prescription drug abuse:  denies Rehab hx: denies  Exam Findings  Physical Exam: denies Vital Signs:  Temp:  [98.4 F (36.9 C)] 98.4 F (36.9 C) (05/06 1051) Pulse Rate:  [81] 81 (05/06 1051) Resp:  [16] 16 (05/06 1051) BP: (139)/(91) 139/91 (05/06 1051) SpO2:  [100 %] 100 % (05/06 1051) Weight:  [54 kg] 54 kg (05/06 1054) Blood pressure (!) 139/91, pulse 81, temperature 98.4 F (36.9 C), temperature source Oral, resp. rate 16, height 5\' 2"  (1.575 m), weight 54 kg, last menstrual period 11/07/2023, SpO2 100%. Body mass index is 21.77 kg/m.  Physical Exam Vitals and nursing note reviewed.  Neurological:     Mental Status: She is alert and oriented to person, place, and time.     Mental Status Exam: General Appearance: Well Groomed  Orientation:  Full (Time, Place, and Person)  Memory:  Immediate;   Good Recent;   Good  Concentration:  Concentration: Good  Recall:  Good  Attention  Good  Eye Contact:  Good  Speech:  Clear and Coherent  Language:  Good  Volume:  Normal  Mood: good  Affect:  Appropriate and  Congruent  Thought Process:  Coherent  Thought Content:  WDL  Suicidal Thoughts:  No  Homicidal Thoughts:  No  Judgement:  Fair  Insight:  Fair  Psychomotor Activity:  Normal  Akathisia:  No  Fund of Knowledge:  Good      Assets:  Communication Skills Desire for Improvement Housing Intimacy Leisure Time Physical Health Resilience Social Support  Cognition:  WNL  ADL's:  Intact  AIMS (if indicated):        Other History   These have been pulled in through the EMR, reviewed, and updated if appropriate.  Family History:  The patient's family history includes Heart disease in her father.  Medical History: Past Medical History:  Diagnosis Date   Anxiety    Asthma    Depression     Surgical History: Past Surgical History:  Procedure Laterality Date   BARIATRIC SURGERY       Medications:  No current facility-administered medications for this encounter.  Current  Outpatient Medications:    albuterol  (PROVENTIL ) (2.5 MG/3ML) 0.083% nebulizer solution, Take 6 mLs (5 mg total) by nebulization every 6 (six) hours as needed for wheezing or shortness of breath., Disp: 75 mL, Rfl: 12   Multiple Vitamin (MULTIVITAMIN) tablet, Take 1 tablet by mouth daily., Disp: , Rfl:    escitalopram (LEXAPRO) 10 MG tablet, Take 1 tablet (10 mg total) by mouth at bedtime., Disp: 14 tablet, Rfl: 0   hydrOXYzine (ATARAX) 10 MG tablet, Take 1 tablet (10 mg total) by mouth 3 (three) times daily as needed for anxiety., Disp: 30 tablet, Rfl: 0  Allergies: Allergies  Allergen Reactions   Prednisone      Sharp abdominal pain, diarrhea    Roise Cleaver, NP

## 2023-11-23 ENCOUNTER — Telehealth (HOSPITAL_COMMUNITY): Payer: Self-pay | Admitting: Professional

## 2023-12-18 ENCOUNTER — Ambulatory Visit
Admission: RE | Admit: 2023-12-18 | Discharge: 2023-12-18 | Discharge status: Home / Self Care | Payer: Self-pay | Source: Ambulatory Visit | Attending: Family Medicine

## 2023-12-18 VITALS — BP 132/90 | HR 81 | Temp 98.7°F | Resp 18

## 2023-12-18 DIAGNOSIS — Z113 Encounter for screening for infections with a predominantly sexual mode of transmission: Secondary | ICD-10-CM

## 2023-12-18 DIAGNOSIS — N898 Other specified noninflammatory disorders of vagina: Secondary | ICD-10-CM

## 2023-12-18 LAB — POCT URINALYSIS DIP (MANUAL ENTRY)
Bilirubin, UA: NEGATIVE
Blood, UA: NEGATIVE
Glucose, UA: NEGATIVE mg/dL
Ketones, POC UA: NEGATIVE mg/dL
Leukocytes, UA: NEGATIVE
Nitrite, UA: NEGATIVE
Protein Ur, POC: NEGATIVE mg/dL
Spec Grav, UA: 1.025 (ref 1.010–1.025)
Urobilinogen, UA: 1 U/dL
pH, UA: 7 (ref 5.0–8.0)

## 2023-12-18 LAB — POCT URINE PREGNANCY: Preg Test, Ur: NEGATIVE

## 2023-12-18 MED ORDER — METRONIDAZOLE 500 MG PO TABS: 500.0000 mg | ORAL_TABLET | Freq: Two times a day (BID) | ORAL | 0 refills | Status: DC

## 2023-12-18 NOTE — ED Triage Notes (Signed)
 Pt presents with lower abdominal pain off and  and vaginal discharge x 2-3 weeks. Pt would like STD testing.

## 2023-12-18 NOTE — ED Provider Notes (Signed)
 [facility]    CSN: [id number] Arrival date & time: 12/18/23  1019      History   Chief Complaint Chief Complaint  Patient presents with   Abdominal Pain    Abnormal discharge & abdominal pain, can't get an appt with pcp for a month, and work doesn't permit me to go to a clinic due to hours. Having abdominal pain w/ the discharge. - Entered by patient    HPI Gina Trujillo is a 33 y.o. female presents for vaginal discharge.  Patient reports 2 to 3 weeks of a malodorous vaginal discharge that is nonpruritic.  Endorses intermittent lower abdominal cramping as well but denies any nausea/vomiting/diarrhea, fevers, chills or dysuria/hematuria.  No STD exposure or concern but would like screening.  Does report history of BV and states this seems similar to that.  No OTC treatments have been used since onset.  No other concerns at this time.   Abdominal Pain Associated symptoms: vaginal discharge     Past Medical History:  Diagnosis Date   Anxiety    Asthma    Depression     Patient Active Problem List   Diagnosis Date Noted   Acute stress reaction 11/22/2023    Past Surgical History:  Procedure Laterality Date   BARIATRIC SURGERY      OB History   No obstetric history on file.      Home Medications    Prior to Admission medications   Medication Sig Start Date End Date Taking? Authorizing Provider  ARIPiprazole (ABILIFY) 5 MG tablet Take 5 mg by mouth daily. 11/25/23  Yes [provider]  metroNIDAZOLE  (FLAGYL ) 500 MG tablet Take 1 tablet (500 mg total) by mouth 2 (two) times daily. 12/18/23  Yes Jesus Poplin, Jodi R, NP  albuterol  (PROVENTIL ) (2.5 MG/3ML) 0.083% nebulizer solution Take 6 mLs (5 mg total) by nebulization every 6 (six) hours as needed for wheezing or shortness of breath. 08/13/14   Muthersbaugh, Alisa App, PA-C  escitalopram  (LEXAPRO ) 10 MG tablet Take 1 tablet (10 mg total) by mouth at bedtime. 11/22/23   Roise Cleaver, NP  hydrOXYzine  (ATARAX ) 10  MG tablet Take 1 tablet (10 mg total) by mouth 3 (three) times daily as needed for anxiety. 11/22/23  Yes Roise Cleaver, NP  Multiple Vitamin (MULTIVITAMIN) tablet Take 1 tablet by mouth daily.    [provider]    Family History Family History  Problem Relation Age of Onset   Heart disease Father     Social History Social History   Tobacco Use   Smoking status: Never   Smokeless tobacco: Never  Vaping Use   Vaping status: Never Used  Substance Use Topics   Alcohol use: Yes    Alcohol/week: 0.0 standard drinks of alcohol    Comment: Socially   Drug use: No     Allergies   Prednisone    Review of Systems Review of Systems  Gastrointestinal:  Positive for abdominal pain.  Genitourinary:  Positive for vaginal discharge.     Physical Exam Triage Vital Signs ED Triage Vitals  Encounter Vitals Group     BP 12/18/23 1047 (!) 132/90     Systolic BP Percentile --      Diastolic BP Percentile --      Pulse Rate 12/18/23 1047 81     Resp 12/18/23 1047 18     Temp 12/18/23 1047 98.7 F (37.1 C)     Temp Source 12/18/23 1047 Oral     SpO2 12/18/23 1047  98 %     Weight --      Height --      Head Circumference --      Peak Flow --      Pain Score 12/18/23 1045 5     Pain Loc --      Pain Education --      Exclude from Growth Chart --    No data found.  Updated Vital Signs BP (!) 132/90 (BP Location: Left Arm)   Pulse 81   Temp 98.7 F (37.1 C) (Oral)   Resp 18   LMP 11/28/2023 (Approximate)   SpO2 98%   Visual Acuity Right Eye Distance:   Left Eye Distance:   Bilateral Distance:    Right Eye Near:   Left Eye Near:    Bilateral Near:     Physical Exam Vitals and nursing note reviewed.  Constitutional:      General: She is not in acute distress.    Appearance: Normal appearance. She is not ill-appearing.  HENT:     Head: Normocephalic and atraumatic.  Eyes:     Pupils: Pupils are equal, round, and reactive to light.  Cardiovascular:      Rate and Rhythm: Normal rate.  Pulmonary:     Effort: Pulmonary effort is normal.  Abdominal:     General: Bowel sounds are normal. There is no distension.     Palpations: Abdomen is soft.     Tenderness: There is no abdominal tenderness. There is no right CVA tenderness, left CVA tenderness or guarding.  Skin:    General: Skin is warm and dry.  Neurological:     General: No focal deficit present.     Mental Status: She is alert and oriented to person, place, and time.  Psychiatric:        Mood and Affect: Mood normal.        Behavior: Behavior normal.      UC Treatments / Results  Labs (all labs ordered are listed, but only abnormal results are displayed) Labs Reviewed  POCT URINE PREGNANCY  POCT URINALYSIS DIP (MANUAL ENTRY)  CERVICOVAGINAL ANCILLARY ONLY    EKG   Radiology No results found.  Procedures Procedures (including critical care time)  Medications Ordered in UC Medications - No data to display  Initial Impression / Assessment and Plan / UC Course  I have reviewed the triage vital signs and the nursing notes.  Pertinent labs & imaging results that were available during my care of the patient were reviewed by me and considered in my medical decision making (see chart for details).     Reviewed exam and symptoms with patient.  No red flags.  Urine negative for UTI.  Vaginal swab was ordered we will contact for any positive results.  Will start metronidazole  as patient reports symptoms are similar to previous BV infections.  Discussed rest fluids and PCP or GYN follow-up if symptoms do not improve.  ER precautions reviewed. Final Clinical Impressions(s) / UC Diagnoses   Final diagnoses:  Vaginal discharge  Screening examination for STD (sexually transmitted disease)     Discharge Instructions      The clinical contact you with results of the STD testing/vaginal swab done today positive.  Start metronidazole  twice daily for 7 days.  No alcohol while  you are on this medication.  Please follow-up with your PCP or gynecologist if your symptoms do not improve.  Please go to the ER for any worsening symptoms.  Hope  you feel better soon!  ED Prescriptions     Medication Sig Dispense Auth. Provider   metroNIDAZOLE  (FLAGYL ) 500 MG tablet Take 1 tablet (500 mg total) by mouth 2 (two) times daily. 14 tablet Anis Cinelli, Jodi R, NP      PDMP not reviewed this encounter.   Alleen Arbour, NP 12/18/23 1119

## 2023-12-18 NOTE — Discharge Instructions (Signed)
 The clinical contact you with results of the STD testing/vaginal swab done today positive.  Start metronidazole  twice daily for 7 days.  No alcohol while you are on this medication.  Please follow-up with your PCP or gynecologist if your symptoms do not improve.  Please go to the ER for any worsening symptoms.  Hope you feel better soon!

## 2023-12-19 LAB — CERVICOVAGINAL ANCILLARY ONLY
Bacterial Vaginitis (gardnerella): POSITIVE — AB
Candida Glabrata: NEGATIVE
Candida Vaginitis: NEGATIVE
Chlamydia: NEGATIVE
Comment: NEGATIVE
Comment: NEGATIVE
Comment: NEGATIVE
Comment: NEGATIVE
Comment: NEGATIVE
Comment: NORMAL
Neisseria Gonorrhea: NEGATIVE
Trichomonas: NEGATIVE

## 2023-12-20 ENCOUNTER — Ambulatory Visit (HOSPITAL_COMMUNITY): Payer: Self-pay

## 2024-02-01 IMAGING — DX DG CHEST 2V
2 series · 2 of 2 positions shown · non-contrast
Comparison: 08/12/2014

CLINICAL DATA: Cough and wheezing for 2 weeks

EXAM:
CHEST - 2 VIEW

[chest pa]
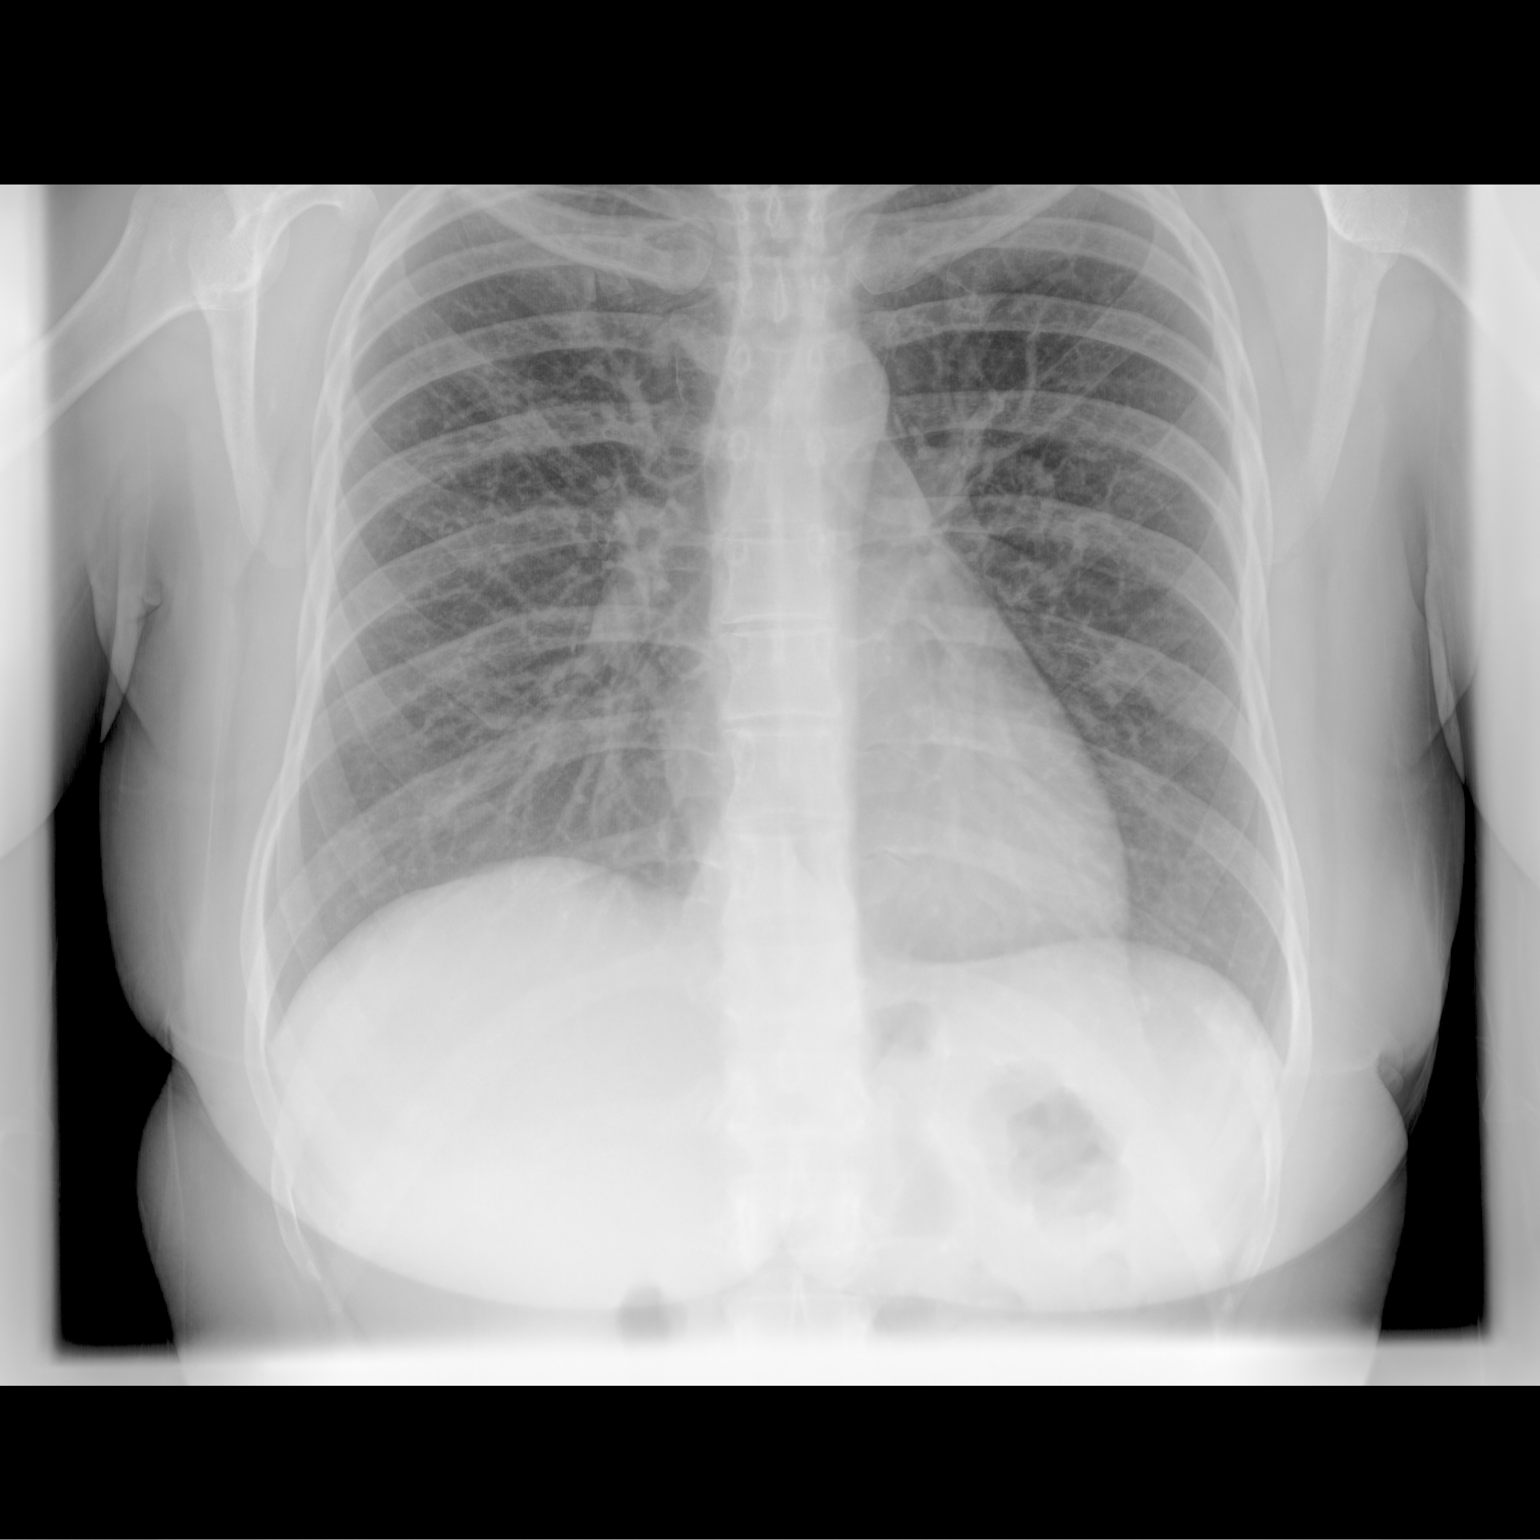

[chest lat]
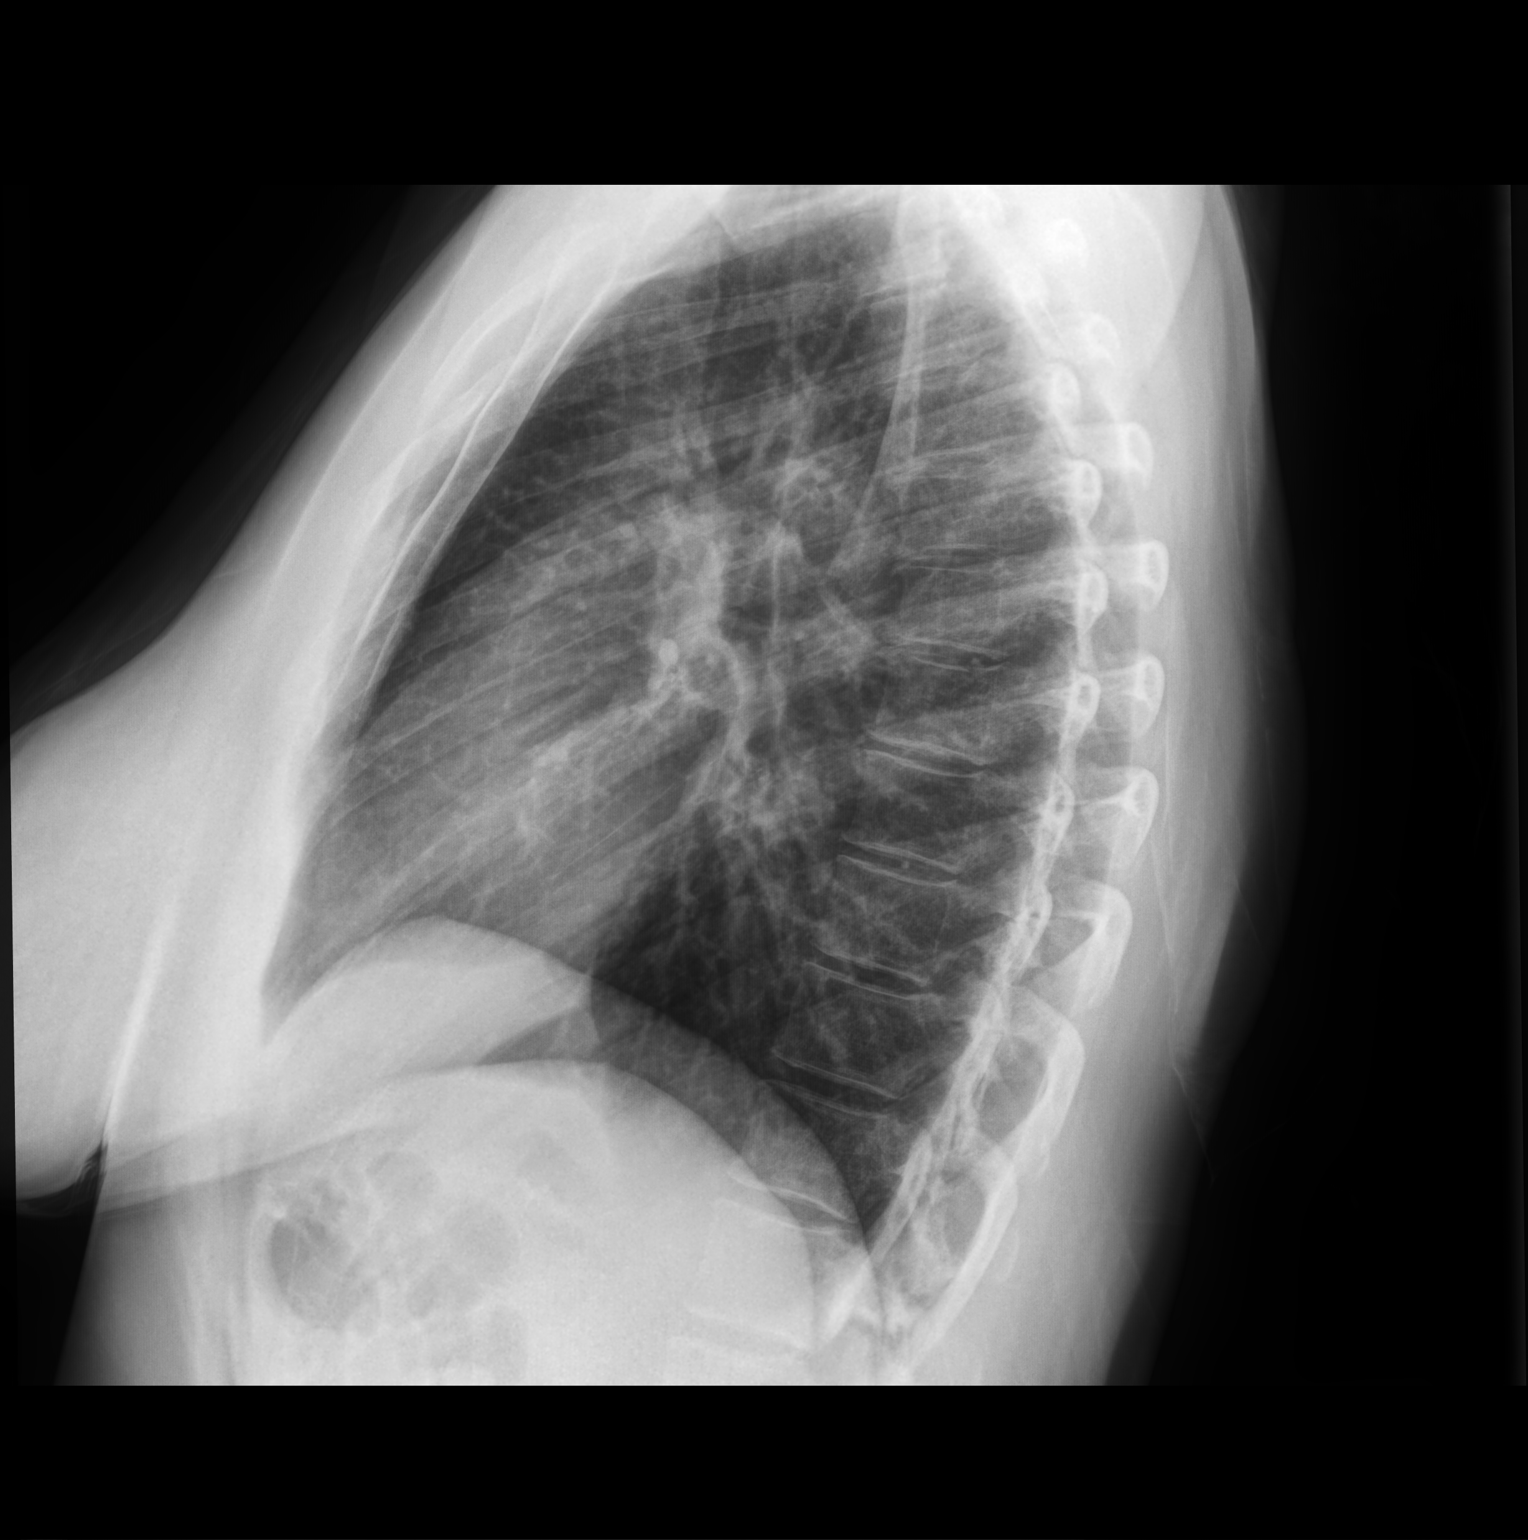

[2 of 2 positions shown; findings below may reference images not displayed]

FINDINGS: Cardiac shadow is within normal limits. The lungs are well aerated
bilaterally. Mild increased interstitial markings are seen without
focal confluent infiltrate. No effusion is seen. No bony abnormality
is noted.
IMPRESSION: Mild increased interstitial changes which may be related to some
bronchitis. No focal infiltrate is seen.

## 2024-05-15 ENCOUNTER — Other Ambulatory Visit: Payer: Self-pay | Admitting: Medical Genetics

## 2024-05-15 DIAGNOSIS — Z006 Encounter for examination for normal comparison and control in clinical research program: Secondary | ICD-10-CM

## 2024-08-17 ENCOUNTER — Ambulatory Visit
Admission: RE | Admit: 2024-08-17 | Discharge: 2024-08-17 | Disposition: A | Discharge status: Home / Self Care | Payer: Self-pay | Source: Ambulatory Visit | Attending: Nurse Practitioner | Admitting: Nurse Practitioner

## 2024-08-17 VITALS — BP 144/100 | HR 85 | Temp 98.0°F | Resp 16

## 2024-08-17 DIAGNOSIS — R030 Elevated blood-pressure reading, without diagnosis of hypertension: Secondary | ICD-10-CM | POA: Insufficient documentation

## 2024-08-17 DIAGNOSIS — N898 Other specified noninflammatory disorders of vagina: Secondary | ICD-10-CM | POA: Insufficient documentation

## 2024-08-17 DIAGNOSIS — K649 Unspecified hemorrhoids: Secondary | ICD-10-CM | POA: Insufficient documentation

## 2024-08-17 NOTE — ED Provider Notes (Signed)
 " [facility]    CSN: [id number] Arrival date & time: 08/17/24  1840      History   Chief Complaint Chief Complaint  Patient presents with   Vaginal Discharge    Also hemorrhoid - Entered by patient   Hemorrhoids    HPI Gina Trujillo is a 34 y.o. female.   1.  Patient is here requesting evaluation for the new onset of thin clear odorous vaginal discharge that began approximately 5 days ago.  Denies any associated abnormal vaginal bleeding.  No reported vaginal or pelvic pain.  She is sexually active with 1 female partner and denies concern for sexually transmitted infections.  No concerns about pregnancy.  No reported fever, abdominal pain, dysuria, or urinary urgency.  No reported vaginal rash or lesions. 2.  Patient also mentions that she may have a hemorrhoid in her rectal area.  Denies previous history.  States it feels like a ball in that area.  She denies concerns about constipation.  She endorses having a cough prior to the onset which may have contributed to this.  Has not tried any interventions at this time for it.  No reported rectal bleeding.  The history is provided by the patient.  Vaginal Discharge Associated symptoms: no abdominal pain, no dysuria, no fever and no vomiting     Past Medical History:  Diagnosis Date   Anxiety    Asthma    Depression     Patient Active Problem List   Diagnosis Date Noted   Acute stress reaction 11/22/2023    Past Surgical History:  Procedure Laterality Date   BARIATRIC SURGERY      OB History   No obstetric history on file.      Home Medications    Prior to Admission medications  Medication Sig Start Date End Date Taking? Authorizing Provider  albuterol  (PROVENTIL ) (2.5 MG/3ML) 0.083% nebulizer solution Take 6 mLs (5 mg total) by nebulization every 6 (six) hours as needed for wheezing or shortness of breath. 08/13/14   Muthersbaugh, Chiquita, PA-C  ARIPiprazole (ABILIFY) 5 MG tablet Take 5 mg by mouth  daily. 11/25/23   [provider]  escitalopram  (LEXAPRO ) 10 MG tablet Take 1 tablet (10 mg total) by mouth at bedtime. 11/22/23   Mardy Legacy, NP  hydrOXYzine  (ATARAX ) 10 MG tablet Take 1 tablet (10 mg total) by mouth 3 (three) times daily as needed for anxiety. 11/22/23   Mardy Legacy, NP  Multiple Vitamin (MULTIVITAMIN) tablet Take 1 tablet by mouth daily.    [provider]    Family History Family History  Problem Relation Age of Onset   Heart disease Father     Social History Social History[1]   Allergies   Prednisone    Review of Systems Review of Systems  Constitutional:  Negative for chills and fever.  Respiratory:  Negative for cough and shortness of breath.   Cardiovascular:  Negative for chest pain.  Gastrointestinal:  Positive for rectal pain. Negative for abdominal pain, anal bleeding, constipation, diarrhea and vomiting.       Positive for hemorrhoid.  Genitourinary:  Positive for vaginal discharge. Negative for dysuria, frequency, genital sores, pelvic pain, vaginal bleeding and vaginal pain.  Musculoskeletal:  Negative for back pain and myalgias.  Skin:  Negative for rash.  Neurological:  Negative for dizziness and headaches.  Psychiatric/Behavioral:  The patient is not nervous/anxious.     Physical Exam Triage Vital Signs ED Triage Vitals  Encounter Vitals Group  BP 08/17/24 1921 (!) 141/100     Girls Systolic BP Percentile --      Girls Diastolic BP Percentile --      Boys Systolic BP Percentile --      Boys Diastolic BP Percentile --      Pulse Rate 08/17/24 1921 85     Resp 08/17/24 1921 16     Temp 08/17/24 1921 98 F (36.7 C)     Temp Source 08/17/24 1921 Oral     SpO2 08/17/24 1921 98 %     Weight --      Height --      Head Circumference --      Peak Flow --      Pain Score 08/17/24 1919 0     Pain Loc --      Pain Education --      Exclude from Growth Chart --    No data found.  Updated Vital Signs BP (!)  144/100 (BP Location: Left Arm)   Pulse 85   Temp 98 F (36.7 C) (Oral)   Resp 16   LMP 08/02/2024 (Exact Date)   SpO2 98%    Physical Exam Vitals and nursing note reviewed.  Constitutional:      Appearance: Normal appearance.  Cardiovascular:     Rate and Rhythm: Normal rate and regular rhythm.     Heart sounds: Normal heart sounds. No murmur heard. Pulmonary:     Breath sounds: Normal breath sounds. No wheezing, rhonchi or rales.  Abdominal:     General: Bowel sounds are normal.  Skin:    General: Skin is warm and dry.  Neurological:     General: No focal deficit present.     Mental Status: She is alert and oriented to person, place, and time.  Psychiatric:        Mood and Affect: Mood normal.        Behavior: Behavior normal.        Thought Content: Thought content normal.        Judgment: Judgment normal.      UC Treatments / Results  Labs (all labs ordered are listed, but only abnormal results are displayed) Labs Reviewed  CERVICOVAGINAL ANCILLARY ONLY    EKG   Radiology No results found.  Procedures Procedures (including critical care time)  Medications Ordered in UC Medications - No data to display  Initial Impression / Assessment and Plan / UC Course  I have reviewed the triage vital signs and the nursing notes.  Pertinent labs & imaging results that were available during my care of the patient were reviewed by me and considered in my medical decision making (see chart for details).    Patient presents requesting evaluation for abnormal malodorous vaginal discharge x 5 days.  She is sexually active with 1 female partner and is not concern for any sexually transmitted infections.  Patient collected vaginitis panel is currently pending-will notify her if any medications are necessary based on those results. Patient reported a likely hemorrhoid in her anal/rectal area.  No previous history.  I offered to perform an examination and patient declined.   Based upon her reports and description of concern, provided recommendations for management of a hemorrhoid.  Recommended Preparation H, Tucks pads, increase fiber and oral hydration, and warm baths.  Follow-up for reevaluation should these interventions be ineffective. Her blood pressure is elevated on today's exam.  Denies any previous history.  She is not having any symptoms such as  headache, blurred vision, dizziness, or chest pain.  She does mention having a few drinks today.  Discussed that salt and caffeine can sometimes affect this as well as family history.  I advised her to recheck her blood pressure in the next 2 to 3 days and follow-up with her primary care provider if it remains greater than 140/90. Final Clinical Impressions(s) / UC Diagnoses   Final diagnoses:  Vaginal discharge  Hemorrhoids (patient reported)  Elevated blood pressure reading     Discharge Instructions      Pending vaginitis panel for the abnormal vaginal discharge.  We will notify you of those results and any medications that may be needed. For the hemorrhoid, may use Preparation H and Tucks pads to decrease inflammation.  Use a fiber supplement and increase hydration to regulate and soften stools.  May also soak in warm water to decrease discomfort. Your blood pressure was elevated at today's visit.  Recommend increasing oral hydration-monitor salt and caffeine intake.  Please recheck your blood pressure in the next 2 to 3 days and follow-up with your primary care provider if remains greater than 140/90.  Places that you can have your blood pressure rechecked is at a local pharmacy as they have the availability and also call your primary care provider to set up an appointment to have this done.     ED Prescriptions   None    PDMP not reviewed this encounter.    [1]  Social History Tobacco Use   Smoking status: Never   Smokeless tobacco: Never  Vaping Use   Vaping status: Never Used  Substance Use  Topics   Alcohol use: Yes    Alcohol/week: 0.0 standard drinks of alcohol    Comment: Socially   Drug use: No     Janet Therisa PARAS, FNP 08/17/24 1956  "

## 2024-08-17 NOTE — Discharge Instructions (Addendum)
 Pending vaginitis panel for the abnormal vaginal discharge.  We will notify you of those results and any medications that may be needed. For the hemorrhoid, may use Preparation H and Tucks pads to decrease inflammation.  Use a fiber supplement and increase hydration to regulate and soften stools.  May also soak in warm water to decrease discomfort. Your blood pressure was elevated at today's visit.  Recommend increasing oral hydration-monitor salt and caffeine intake.  Please recheck your blood pressure in the next 2 to 3 days and follow-up with your primary care provider if remains greater than 140/90.  Places that you can have your blood pressure rechecked is at a local pharmacy as they have the availability and also call your primary care provider to set up an appointment to have this done.

## 2024-08-17 NOTE — ED Triage Notes (Signed)
 Patient presents for vaginal discharge and odor x 5 days.  Hemorrhoid x 1 week.  Patient has not taken any medication.

## 2024-08-23 ENCOUNTER — Ambulatory Visit (HOSPITAL_COMMUNITY): Payer: Self-pay

## 2024-08-23 LAB — CERVICOVAGINAL ANCILLARY ONLY
Bacterial Vaginitis (gardnerella): POSITIVE — AB
Candida Glabrata: NEGATIVE
Candida Vaginitis: NEGATIVE
Comment: NEGATIVE
Comment: NEGATIVE
Comment: NEGATIVE

## 2024-08-23 MED ORDER — METRONIDAZOLE 500 MG PO TABS: 500.0000 mg | ORAL_TABLET | Freq: Two times a day (BID) | ORAL | 0 refills | Status: AC
